# Patient Record
Sex: Female | Born: 1955 | Hispanic: No | Marital: Married | Smoking: Never smoker
Health system: Southern US, Community
[De-identification: ages and names within clinical notes are randomized; demographics above are authoritative.]

## PROBLEM LIST (undated history)

## (undated) DIAGNOSIS — C50919 Malignant neoplasm of unspecified site of unspecified female breast: Secondary | ICD-10-CM

## (undated) DIAGNOSIS — L409 Psoriasis, unspecified: Secondary | ICD-10-CM

## (undated) DIAGNOSIS — Z923 Personal history of irradiation: Secondary | ICD-10-CM

## (undated) DIAGNOSIS — F32A Depression, unspecified: Secondary | ICD-10-CM

## (undated) DIAGNOSIS — I10 Essential (primary) hypertension: Secondary | ICD-10-CM

## (undated) DIAGNOSIS — Z9221 Personal history of antineoplastic chemotherapy: Secondary | ICD-10-CM

## (undated) DIAGNOSIS — F329 Major depressive disorder, single episode, unspecified: Secondary | ICD-10-CM

## (undated) HISTORY — DX: Malignant neoplasm of unspecified site of unspecified female breast: C50.919

## (undated) HISTORY — DX: Major depressive disorder, single episode, unspecified: F32.9

## (undated) HISTORY — DX: Psoriasis, unspecified: L40.9

## (undated) HISTORY — DX: Essential (primary) hypertension: I10

## (undated) HISTORY — PX: CHOLECYSTECTOMY: SHX55

## (undated) HISTORY — DX: Depression, unspecified: F32.A

---

## 2009-08-15 DIAGNOSIS — Z923 Personal history of irradiation: Secondary | ICD-10-CM

## 2009-08-15 DIAGNOSIS — C50919 Malignant neoplasm of unspecified site of unspecified female breast: Secondary | ICD-10-CM

## 2009-08-15 DIAGNOSIS — Z9221 Personal history of antineoplastic chemotherapy: Secondary | ICD-10-CM

## 2009-08-15 HISTORY — DX: Personal history of antineoplastic chemotherapy: Z92.21

## 2009-08-15 HISTORY — DX: Personal history of irradiation: Z92.3

## 2009-08-15 HISTORY — PX: BREAST LUMPECTOMY: SHX2

## 2009-08-15 HISTORY — DX: Malignant neoplasm of unspecified site of unspecified female breast: C50.919

## 2010-06-01 ENCOUNTER — Ambulatory Visit: Payer: Self-pay | Admitting: Family Medicine

## 2010-06-03 ENCOUNTER — Ambulatory Visit: Payer: Self-pay | Admitting: Family Medicine

## 2010-06-25 ENCOUNTER — Ambulatory Visit: Payer: Self-pay | Admitting: Surgery

## 2010-07-02 ENCOUNTER — Ambulatory Visit: Payer: Self-pay | Admitting: Surgery

## 2010-07-10 ENCOUNTER — Emergency Department: Payer: Self-pay | Admitting: Unknown Physician Specialty

## 2010-07-15 LAB — PATHOLOGY REPORT

## 2010-07-22 ENCOUNTER — Ambulatory Visit: Payer: Self-pay | Admitting: Oncology

## 2010-08-05 ENCOUNTER — Ambulatory Visit: Payer: Self-pay | Admitting: Surgery

## 2010-08-15 ENCOUNTER — Ambulatory Visit: Payer: Self-pay | Admitting: Oncology

## 2010-09-15 ENCOUNTER — Ambulatory Visit: Payer: Self-pay | Admitting: Oncology

## 2010-10-14 ENCOUNTER — Ambulatory Visit: Payer: Self-pay | Admitting: Oncology

## 2010-11-14 ENCOUNTER — Ambulatory Visit: Payer: Self-pay | Admitting: Oncology

## 2010-12-14 ENCOUNTER — Ambulatory Visit: Payer: Self-pay | Admitting: Oncology

## 2011-01-14 ENCOUNTER — Ambulatory Visit: Payer: Self-pay | Admitting: Oncology

## 2011-02-13 ENCOUNTER — Ambulatory Visit: Payer: Self-pay | Admitting: Oncology

## 2011-03-16 ENCOUNTER — Ambulatory Visit: Payer: Self-pay | Admitting: Oncology

## 2011-04-16 ENCOUNTER — Ambulatory Visit: Payer: Self-pay | Admitting: Oncology

## 2011-05-16 ENCOUNTER — Ambulatory Visit: Payer: Self-pay | Admitting: Oncology

## 2011-06-16 ENCOUNTER — Ambulatory Visit: Payer: Self-pay | Admitting: Oncology

## 2011-07-16 ENCOUNTER — Ambulatory Visit: Payer: Self-pay | Admitting: Oncology

## 2011-09-05 ENCOUNTER — Ambulatory Visit: Payer: Self-pay | Admitting: Oncology

## 2011-09-16 ENCOUNTER — Ambulatory Visit: Payer: Self-pay | Admitting: Oncology

## 2011-09-29 LAB — CANCER ANTIGEN 27.29: CA 27.29: 12.4 U/mL (ref 0.0–38.6)

## 2011-10-14 ENCOUNTER — Ambulatory Visit: Payer: Self-pay | Admitting: Oncology

## 2012-01-04 ENCOUNTER — Ambulatory Visit: Payer: Self-pay | Admitting: Oncology

## 2012-01-05 LAB — CANCER ANTIGEN 27.29: CA 27.29: 11.7 U/mL (ref 0.0–38.6)

## 2012-01-14 ENCOUNTER — Ambulatory Visit: Payer: Self-pay | Admitting: Oncology

## 2012-04-11 ENCOUNTER — Ambulatory Visit: Payer: Self-pay | Admitting: Oncology

## 2012-04-13 LAB — CANCER ANTIGEN 27.29: CA 27.29: 14.5 U/mL (ref 0.0–38.6)

## 2012-04-15 ENCOUNTER — Ambulatory Visit: Payer: Self-pay | Admitting: Oncology

## 2012-06-12 ENCOUNTER — Ambulatory Visit: Payer: Self-pay | Admitting: Oncology

## 2012-07-18 ENCOUNTER — Ambulatory Visit: Payer: Self-pay | Admitting: Oncology

## 2012-08-15 ENCOUNTER — Ambulatory Visit: Payer: Self-pay | Admitting: Oncology

## 2012-09-15 ENCOUNTER — Ambulatory Visit: Payer: Self-pay | Admitting: Oncology

## 2012-10-17 ENCOUNTER — Ambulatory Visit: Payer: Self-pay | Admitting: Oncology

## 2012-11-13 ENCOUNTER — Ambulatory Visit: Payer: Self-pay | Admitting: Oncology

## 2012-12-13 ENCOUNTER — Ambulatory Visit: Payer: Self-pay | Admitting: Oncology

## 2012-12-15 ENCOUNTER — Ambulatory Visit: Payer: Self-pay

## 2013-05-10 ENCOUNTER — Ambulatory Visit: Payer: Self-pay | Admitting: Oncology

## 2013-06-14 ENCOUNTER — Ambulatory Visit: Payer: Self-pay | Admitting: Oncology

## 2013-07-24 ENCOUNTER — Ambulatory Visit: Payer: Self-pay | Admitting: Oncology

## 2013-07-25 LAB — CANCER ANTIGEN 27.29: CA 27.29: 8.1 U/mL (ref 0.0–38.6)

## 2013-08-15 ENCOUNTER — Ambulatory Visit: Payer: Self-pay | Admitting: Oncology

## 2013-09-15 ENCOUNTER — Ambulatory Visit: Payer: Self-pay | Admitting: Oncology

## 2013-11-28 ENCOUNTER — Ambulatory Visit: Payer: Self-pay | Admitting: Oncology

## 2013-12-11 ENCOUNTER — Ambulatory Visit: Payer: Self-pay | Admitting: Oncology

## 2013-12-18 ENCOUNTER — Ambulatory Visit: Payer: Self-pay | Admitting: Oncology

## 2013-12-25 ENCOUNTER — Ambulatory Visit: Payer: Self-pay | Admitting: Oncology

## 2014-01-13 ENCOUNTER — Ambulatory Visit: Payer: Self-pay | Admitting: Oncology

## 2014-03-06 DIAGNOSIS — L409 Psoriasis, unspecified: Secondary | ICD-10-CM | POA: Insufficient documentation

## 2014-03-06 DIAGNOSIS — F32A Depression, unspecified: Secondary | ICD-10-CM | POA: Insufficient documentation

## 2014-03-06 DIAGNOSIS — I10 Essential (primary) hypertension: Secondary | ICD-10-CM | POA: Insufficient documentation

## 2014-03-06 DIAGNOSIS — F329 Major depressive disorder, single episode, unspecified: Secondary | ICD-10-CM | POA: Insufficient documentation

## 2014-03-06 DIAGNOSIS — E669 Obesity, unspecified: Secondary | ICD-10-CM | POA: Insufficient documentation

## 2014-05-08 ENCOUNTER — Ambulatory Visit: Payer: Self-pay | Admitting: Oncology

## 2014-05-15 ENCOUNTER — Ambulatory Visit: Payer: Self-pay | Admitting: Oncology

## 2014-06-25 ENCOUNTER — Ambulatory Visit: Payer: Self-pay | Admitting: Oncology

## 2014-07-15 ENCOUNTER — Ambulatory Visit: Payer: Self-pay | Admitting: Oncology

## 2014-09-17 ENCOUNTER — Ambulatory Visit: Payer: Self-pay | Admitting: Oncology

## 2014-10-23 ENCOUNTER — Ambulatory Visit
Admit: 2014-10-23 | Disposition: A | Discharge status: Home / Self Care | Payer: Self-pay | Attending: Radiation Oncology | Admitting: Radiation Oncology

## 2014-11-10 ENCOUNTER — Ambulatory Visit: Payer: Self-pay | Admitting: Registered Nurse

## 2014-12-26 ENCOUNTER — Other Ambulatory Visit: Payer: Self-pay

## 2014-12-26 ENCOUNTER — Ambulatory Visit: Payer: Self-pay | Admitting: Oncology

## 2015-01-01 ENCOUNTER — Other Ambulatory Visit: Payer: Self-pay | Admitting: *Deleted

## 2015-01-01 DIAGNOSIS — C50919 Malignant neoplasm of unspecified site of unspecified female breast: Secondary | ICD-10-CM

## 2015-01-02 ENCOUNTER — Ambulatory Visit: Payer: Self-pay | Admitting: Oncology

## 2015-01-02 ENCOUNTER — Other Ambulatory Visit: Payer: Self-pay

## 2015-01-09 ENCOUNTER — Encounter: Payer: Self-pay | Admitting: Oncology

## 2015-01-09 ENCOUNTER — Inpatient Hospital Stay (HOSPITAL_BASED_OUTPATIENT_CLINIC_OR_DEPARTMENT_OTHER): Payer: BLUE CROSS/BLUE SHIELD | Admitting: Oncology

## 2015-01-09 ENCOUNTER — Inpatient Hospital Stay: Payer: BLUE CROSS/BLUE SHIELD | Attending: Oncology

## 2015-01-09 VITALS — HR 60 | Resp 20 | Wt 300.3 lb

## 2015-01-09 DIAGNOSIS — F419 Anxiety disorder, unspecified: Secondary | ICD-10-CM | POA: Diagnosis not present

## 2015-01-09 DIAGNOSIS — Z79811 Long term (current) use of aromatase inhibitors: Secondary | ICD-10-CM

## 2015-01-09 DIAGNOSIS — Z170 Estrogen receptor positive status [ER+]: Secondary | ICD-10-CM | POA: Diagnosis not present

## 2015-01-09 DIAGNOSIS — C50911 Malignant neoplasm of unspecified site of right female breast: Secondary | ICD-10-CM

## 2015-01-09 DIAGNOSIS — M818 Other osteoporosis without current pathological fracture: Secondary | ICD-10-CM | POA: Diagnosis not present

## 2015-01-09 DIAGNOSIS — I10 Essential (primary) hypertension: Secondary | ICD-10-CM

## 2015-01-09 DIAGNOSIS — C50919 Malignant neoplasm of unspecified site of unspecified female breast: Secondary | ICD-10-CM

## 2015-01-10 LAB — CANCER ANTIGEN 27.29: CA 27.29: 13.4 U/mL (ref 0.0–38.6)

## 2015-01-18 ENCOUNTER — Ambulatory Visit
Admission: EM | Admit: 2015-01-18 | Discharge: 2015-01-18 | Disposition: A | Discharge status: Home / Self Care | Payer: BLUE CROSS/BLUE SHIELD | Attending: Family Medicine | Admitting: Family Medicine

## 2015-01-18 DIAGNOSIS — N61 Inflammatory disorders of breast: Secondary | ICD-10-CM | POA: Diagnosis not present

## 2015-01-18 DIAGNOSIS — Z853 Personal history of malignant neoplasm of breast: Secondary | ICD-10-CM

## 2015-01-18 DIAGNOSIS — R21 Rash and other nonspecific skin eruption: Secondary | ICD-10-CM | POA: Diagnosis not present

## 2015-01-18 MED ORDER — DOXYCYCLINE HYCLATE 100 MG PO CAPS: 100.0000 mg | ORAL_CAPSULE | Freq: Two times a day (BID) | ORAL | Status: DC

## 2015-01-18 MED ORDER — VALACYCLOVIR HCL 1 G PO TABS
1000.0000 mg | ORAL_TABLET | Freq: Three times a day (TID) | ORAL | Status: AC
Start: 2015-01-18 — End: 2015-02-01

## 2015-01-18 NOTE — ED Notes (Signed)
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

## 2015-01-18 NOTE — ED Provider Notes (Signed)
CSN: [id number]     Arrival date & time 01/18/15  0805 History   First MD Initiated Contact with Patient 01/18/15 [phone]     Chief Complaint  Patient presents with  . Recurrent Skin Infections   she states that since having radiation for breast cancer she's had recurrent inflammation and cellulitis of the right breast. She states she will have blistering of the skin as well as redness. She just got through a course of doxycycline last week and a few days later now the eruption has started back again. She states that she's had breast biopsy to make sure that she doesn't have time for breast cancer and it was negative.  (Consider location/radiation/quality/duration/timing/severity/associated sxs/prior Treatment) Patient is a 59 y.o. female presenting with rash. The history is provided by the patient. No language interpreter was used.  Rash Location:  Torso Torso rash location:  R axilla and R chest (R breast) Quality: blistering, itchiness, redness and scaling   Severity:  Severe Onset quality:  Sudden Duration:  2 days Timing:  Constant Progression:  Worsening Chronicity:  Chronic Context comment:  She states is from radiation that she's had before Relieved by: Doxycycline usually helps it but doesn't seem to last as relief is concerned. Associated symptoms: no headaches, no shortness of breath, no tongue swelling and not vomiting     Past Medical History  Diagnosis Date  . Breast cancer   . Hypertension   . Depression   . Psoriasis    Past Surgical History  Procedure Laterality Date  . Cholecystectomy      partial  . Appendectomy     History reviewed. No pertinent family history. History  Substance Use Topics  . Smoking status: Never Smoker   . Smokeless tobacco: Never Used  . Alcohol Use: Yes     Comment: occasional   OB History    No data available     Review of Systems  Respiratory: Negative for shortness of breath.   Gastrointestinal: Negative for vomiting.  Skin:  Positive for rash.  Neurological: Negative for headaches.  All other systems reviewed and are negative.   Allergies  Tape and Cephalexin  Home Medications   Prior to Admission medications   Medication Sig Start Date End Date Taking? Authorizing Provider  Acetaminophen 500 MG coapsule Take by mouth.    Historical Provider, MD  bisoprolol-hydrochlorothiazide (ZIAC) 5-6.25 MG per tablet TAKE 1 TABLET BY MOUTH EVERY DAY 01/07/15   Historical Provider, MD  doxycycline (VIBRAMYCIN) 100 MG capsule Take 1 capsule (100 mg total) by mouth 2 (two) times daily. 01/18/15   Frederich Cha, MD  FLUoxetine (PROZAC) 10 MG capsule  12/31/14   Historical Provider, MD  letrozole [facility]) 2.5 MG tablet  12/25/14   Historical Provider, MD  ondansetron (ZOFRAN-ODT) 8 MG disintegrating tablet  11/10/14   Historical Provider, MD  valACYclovir (VALTREX) 1000 MG tablet Take 1 tablet (1,000 mg total) by mouth 3 (three) times daily. 01/18/15 02/01/15  Frederich Cha, MD   BP 134/69 mmHg  Pulse 90  Temp(Src) 98.9 F (37.2 C) (Tympanic)  Ht 5\' 6"  (1.676 m)  Wt 300 lb (136.079 kg)  BMI 48.44 kg/m2  SpO2 98% Physical Exam  Constitutional: She is oriented to person, place, and time. She appears well-developed and well-nourished. No distress.  Obese white female  HENT:  Head: Normocephalic.  Eyes: Pupils are equal, round, and reactive to light.  Neurological: She is alert and oriented to person, place, and time.  Skin: Skin  is warm. Lesion and rash noted. There is erythema.     Psychiatric: Her behavior is normal.  Vitals reviewed.   ED Course  Procedures (including critical care time) Labs Review Labs Reviewed - No data to display  Imaging Review No results found.  Patient lesion doesn't completely laboratory such as a type of inflammatory breast cancer with the blistering he could also make a case of shingles. I strongly suggest that she needs to see her oncologist if he wants to do repeat biopsies she should  submit to repeat biopsies. She also may need to see a dermatologist specializes in oncology. I was quickly rebuffed due to the cost and convenience this would have her experience. I tried to sympathize with her concerns but stress one the first thing she asked for stronger anabiotic and is difficult to give a different stronger anabiotic if you don't know which dealing with.  Since doxycycline's help before I'm going to put her back on doxycycline for 10 days since there is some blistering I will also place her on Valtrex in case there is some type of herpetic component to this. When she needs a stronger anabiotic like Zyvox for resistant staph or other infection further documentation as far as failure of current anabiotic's and culture will be needed.  She seems to be acceptable with the above plan.  MDM   1. Rash/skin eruption   2. Cellulitis of areola or nipple of female   3. Hx of breast cancer        Frederich Cha, MD 01/18/15 [phone]

## 2015-01-18 NOTE — Discharge Instructions (Signed)
Rash A rash is a change in the color or feel of your skin. There are many different types of rashes. You may have other problems along with your rash. HOME CARE  Avoid the thing that caused your rash.  Do not scratch your rash.  You may take cools baths to help stop itching.  Only take medicines as told by your doctor.  Keep all doctor visits as told. GET HELP RIGHT AWAY IF:   Your pain, puffiness (swelling), or redness gets worse.  You have a fever.  You have new or severe problems.  You have body aches, watery poop (diarrhea), or you throw up (vomit).  Your rash is not better after 3 days. MAKE SURE YOU:   Understand these instructions.  Will watch your condition.  Will get help right away if you are not doing well or get worse. Document Released: 01/18/2008 Document Revised: 10/24/2011 Document Reviewed: 05/16/2011 [facility] Patient Information 2015 [place], [place]. This information is not intended to replace advice given to you by your health care provider. Make sure you discuss any questions you have with your health care provider.  Cellulitis Cellulitis is an infection of the skin and the tissue under the skin. The infected area is usually red and tender. This happens most often in the arms and lower legs. HOME CARE   Take your antibiotic medicine as told. Finish the medicine even if you start to feel better.  Keep the infected arm or leg raised (elevated).  Put a warm cloth on the area up to 4 times per day.  Only take medicines as told by your doctor.  Keep all doctor visits as told. GET HELP IF:  You see red streaks on the skin coming from the infected area.  Your red area gets bigger or turns a dark color.  Your bone or joint under the infected area is painful after the skin heals.  Your infection comes back in the same area or different area.  You have a puffy (swollen) bump in the infected area.  You have new symptoms.  You have a fever. GET  HELP RIGHT AWAY IF:   You feel very sleepy.  You throw up (vomit) or have watery poop (diarrhea).  You feel sick and have muscle aches and pains. MAKE SURE YOU:   Understand these instructions.  Will watch your condition.  Will get help right away if you are not doing well or get worse. Document Released: 01/18/2008 Document Revised: 12/16/2013 Document Reviewed: 10/17/2011 [facility] Patient Information 2015 [place], [place]. This information is not intended to replace advice given to you by your health care provider. Make sure you discuss any questions you have with your health care provider.

## 2015-01-19 ENCOUNTER — Telehealth: Payer: Self-pay | Admitting: *Deleted

## 2015-01-19 NOTE — Telephone Encounter (Signed)
Per Dr Grayland Ormond patient to see a dermatologist, to be referred by her PMD. Patient informed of this and she said ok thank you

## 2015-01-26 ENCOUNTER — Other Ambulatory Visit: Payer: Self-pay | Admitting: *Deleted

## 2015-01-26 DIAGNOSIS — C50411 Malignant neoplasm of upper-outer quadrant of right female breast: Secondary | ICD-10-CM | POA: Insufficient documentation

## 2015-01-26 MED ORDER — LETROZOLE 2.5 MG PO TABS: 2.5000 mg | ORAL_TABLET | Freq: Every day | ORAL | Status: DC

## 2015-01-26 NOTE — Progress Notes (Signed)
[place]  [phone] [phone]  ID: Virgil Benedict OB: [birth year]  MR#: [id number]  BCW#:[id number]  Patient Care Team: Sherrin Daisy, MD as PCP - General (Family Medicine)  CHIEF COMPLAINT:  Chief Complaint  Patient presents with  . Follow-up    breast cancer    INTERVAL HISTORY: Patient returns to clinic today for routine six-month follow-up. She again has increased erythema of her right breast. Previously biopsy did not reveal any malignancy. She continues to tolerate letrozole well without significant side effects. She does not complain of hot flashes today.  She denies any myalgias or arthralgias.  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  She denies any weakness or fatigue.  She has no chest pain, shortness of breath, or cough. Patient offers no further specific complaints today.   REVIEW OF SYSTEMS:   Review of Systems  Constitutional: Negative.   Respiratory: Negative.   Gastrointestinal: Negative.   Musculoskeletal: Negative.   Skin: Positive for rash.       Erythema    As per HPI. Otherwise, a complete review of systems is negatve.  PAST MEDICAL HISTORY: Past Medical History  Diagnosis Date  . Breast cancer   . Hypertension   . Depression   . Psoriasis     PAST SURGICAL HISTORY: Past Surgical History  Procedure Laterality Date  . Cholecystectomy      partial  . Appendectomy      FAMILY HISTORY No family history on file.     ADVANCED DIRECTIVES:    HEALTH MAINTENANCE: History  Substance Use Topics  . Smoking status: Never Smoker   . Smokeless tobacco: Never Used  . Alcohol Use: Yes     Comment: occasional     Colonoscopy:  PAP:  Bone density:  Lipid panel:  Allergies  Allergen Reactions  . Tape Itching  . Cephalexin Rash    Current Outpatient Prescriptions  Medication Sig Dispense Refill  . Acetaminophen 500 MG coapsule Take by mouth.    . bisoprolol-hydrochlorothiazide (ZIAC)  5-6.25 MG per tablet TAKE 1 TABLET BY MOUTH EVERY DAY    . FLUoxetine (PROZAC) 10 MG capsule     . ondansetron (ZOFRAN-ODT) 8 MG disintegrating tablet   0  . doxycycline (VIBRAMYCIN) 100 MG capsule Take 1 capsule (100 mg total) by mouth 2 (two) times daily. 20 capsule 0  . letrozole (FEMARA) 2.5 MG tablet Take 1 tablet (2.5 mg total) by mouth daily. 90 tablet 4  . valACYclovir (VALTREX) 1000 MG tablet Take 1 tablet (1,000 mg total) by mouth 3 (three) times daily. 21 tablet 0   No current facility-administered medications for this visit.    OBJECTIVE: Filed Vitals:   01/09/15 1150  Pulse: 60  Resp: 20     There is no height on file to calculate BMI.    ECOG FS:0 - Asymptomatic  General: Well-developed, well-nourished, no acute distress. Eyes: anicteric sclera. Breasts: Bilateral breast and axilla without lumps or masses. Lungs: Clear to auscultation bilaterally. Heart: Regular rate and rhythm. No rubs, murmurs, or gallops. Abdomen: Soft, nontender, nondistended. No organomegaly noted, normoactive bowel sounds. Musculoskeletal: No edema, cyanosis, or clubbing. Neuro: Alert, answering all questions appropriately. Cranial nerves grossly intact. Skin: Active psoriasis on much of her torso, increased erythema of right breast. Psych: Normal affect.   LAB RESULTS:  No results found for: NA, K, CL, CO2, GLUCOSE, BUN, CREATININE, CALCIUM, PROT, ALBUMIN, AST, ALT, ALKPHOS, BILITOT, GFRNONAA, GFRAA  No results found for: WBC, NEUTROABS, HGB, HCT,  MCV, PLT   STUDIES: No results found.  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  PLAN:    1.  Breast cancer: Continue letrozole daily, completing in August of 2017.  Patient's most recent mammogram and ultrasound on September 17, 2014 was reported as BI-RADS 2, repeat in one year.  Bone mineral density on November 28, 2013 was reported as T score of -2.6, continue Fosamax as prescribed. Patient has  been instructed to continue calcium and vitamin D.  Return to clinic in 6 months for routine evaluation. Patient understands she can return to clinic at any time if she has any questions, concerns, or complaints. 2.  Anxiety: Continue Prozac 20 mg as well as Xanax 0.25 mg as needed. 3.  Erythema: Resolved.  Biopsy was negative for malignancy, return to dermatology as scheduled. 4. Osteoporosis: Bone marrow density as above. Continue Fosamax, calcium, and vitamin D.  Patient expressed understanding and was in agreement with this plan. She also understands that She can call clinic at any time with any questions, concerns, or complaints.   Breast cancer   Staging form: Breast, AJCC 7th Edition     Clinical stage from 01/26/2015: Stage IIA (T1c, N1, M0) - Signed by Lloyd Huger, MD on 01/26/2015   Lloyd Huger, MD   01/26/2015 4:34 PM

## 2015-02-23 ENCOUNTER — Telehealth: Payer: Self-pay | Admitting: *Deleted

## 2015-02-23 NOTE — Telephone Encounter (Signed)
Pt walked into [facility] in [place] this afternoon holding a pathology report. She states the dermatologist sent her pathology slides out from last Oct for a second opinion. She has that report. Patient is concerned  And wondering what she should do about her breast now.1- should she have another biopsy? 2- does she need a PET/MRI or somekind of scan done.3- does she need a mastectomy? Patient requested I send a report to her surgeon, Dr Tamala Julian. I am faxing the path report to him today. I have a copy for Dr Grayland Ormond to evaluate here as well.

## 2015-03-20 ENCOUNTER — Other Ambulatory Visit: Payer: Self-pay | Admitting: *Deleted

## 2015-03-20 ENCOUNTER — Inpatient Hospital Stay: Payer: BLUE CROSS/BLUE SHIELD | Attending: Oncology | Admitting: Oncology

## 2015-03-20 ENCOUNTER — Inpatient Hospital Stay: Payer: BLUE CROSS/BLUE SHIELD

## 2015-03-20 VITALS — HR 63 | Temp 98.0°F | Resp 16 | Wt 300.5 lb

## 2015-03-20 DIAGNOSIS — I10 Essential (primary) hypertension: Secondary | ICD-10-CM | POA: Diagnosis not present

## 2015-03-20 DIAGNOSIS — L539 Erythematous condition, unspecified: Secondary | ICD-10-CM | POA: Diagnosis not present

## 2015-03-20 DIAGNOSIS — M810 Age-related osteoporosis without current pathological fracture: Secondary | ICD-10-CM | POA: Diagnosis not present

## 2015-03-20 DIAGNOSIS — Z79899 Other long term (current) drug therapy: Secondary | ICD-10-CM | POA: Diagnosis not present

## 2015-03-20 DIAGNOSIS — C50911 Malignant neoplasm of unspecified site of right female breast: Secondary | ICD-10-CM | POA: Diagnosis present

## 2015-03-20 DIAGNOSIS — Z79811 Long term (current) use of aromatase inhibitors: Secondary | ICD-10-CM | POA: Diagnosis not present

## 2015-03-20 DIAGNOSIS — Z170 Estrogen receptor positive status [ER+]: Secondary | ICD-10-CM | POA: Insufficient documentation

## 2015-03-20 DIAGNOSIS — F419 Anxiety disorder, unspecified: Secondary | ICD-10-CM | POA: Diagnosis not present

## 2015-03-20 DIAGNOSIS — F329 Major depressive disorder, single episode, unspecified: Secondary | ICD-10-CM | POA: Insufficient documentation

## 2015-03-20 DIAGNOSIS — I890 Lymphedema, not elsewhere classified: Secondary | ICD-10-CM | POA: Diagnosis not present

## 2015-03-20 NOTE — Progress Notes (Signed)
[place]  [phone] [phone]  ID: Tiffany Wilson OB: October 19, 1955  MR#: [id number]  XQJ#:[id number]  Patient Care Team: Sherrin Daisy, MD as PCP - General (Family Medicine)  CHIEF COMPLAINT:  Chief Complaint  Patient presents with  . Follow-up    Pt is here today to discuss lymphedema of her right arm and further testing...    INTERVAL HISTORY: Patient returns to clinic today as an add-on for further evaluation of her persistently erythematous right breast. Previously biopsy did not reveal any malignancy. Multiple courses of anti-biotics have not seen to help.  She continues to tolerate letrozole well without significant side effects. She does not complain of hot flashes today.  She denies any myalgias or arthralgias.  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  She denies any weakness or fatigue.  She has no chest pain, shortness of breath, or cough. Patient offers no further specific complaints today.   REVIEW OF SYSTEMS:   Review of Systems  Constitutional: Negative.   Respiratory: Negative.   Gastrointestinal: Negative.   Musculoskeletal: Negative.   Skin: Positive for rash.       Erythema    As per HPI. Otherwise, a complete review of systems is negatve.  PAST MEDICAL HISTORY: Past Medical History  Diagnosis Date  . Breast cancer   . Hypertension   . Depression   . Psoriasis     PAST SURGICAL HISTORY: Past Surgical History  Procedure Laterality Date  . Cholecystectomy      partial  . Appendectomy      FAMILY HISTORY No family history on file.     ADVANCED DIRECTIVES:    HEALTH MAINTENANCE: History  Substance Use Topics  . Smoking status: Never Smoker   . Smokeless tobacco: Never Used  . Alcohol Use: Yes     Comment: occasional     Colonoscopy:  PAP:  Bone density:  Lipid panel:  Allergies  Allergen Reactions  . Tape Itching  . Cephalexin Rash    Current Outpatient Prescriptions  Medication  Sig Dispense Refill  . Acetaminophen 500 MG coapsule Take 2 capsules by mouth every 6 (six) hours as needed.     . bisoprolol-hydrochlorothiazide (ZIAC) 5-6.25 MG per tablet TAKE 1 TABLET BY MOUTH EVERY DAY    . doxycycline (VIBRAMYCIN) 100 MG capsule Take 1 capsule (100 mg total) by mouth 2 (two) times daily. 20 capsule 0  . fluconazole (DIFLUCAN) 200 MG tablet TK 1 T PO WEEKLY  3  . FLUoxetine (PROZAC) 10 MG capsule     . ibuprofen (ADVIL,MOTRIN) 200 MG tablet Take 800 mg by mouth every 8 (eight) hours as needed for moderate pain.    Marland Kitchen letrozole (FEMARA) 2.5 MG tablet Take 1 tablet (2.5 mg total) by mouth daily. 90 tablet 4  . ondansetron (ZOFRAN-ODT) 8 MG disintegrating tablet   0  . triamcinolone ointment (KENALOG) 0.1 % APP AA BID  0   No current facility-administered medications for this visit.    OBJECTIVE: Filed Vitals:   03/20/15 1355  Pulse: 63  Temp: 98 F (36.7 C)  Resp: 16     Body mass index is 48.52 kg/(m^2).    ECOG FS:0 - Asymptomatic  General: Well-developed, well-nourished, no acute distress. Eyes: anicteric sclera. Breasts: Right breast with significant erythema. Lungs: Clear to auscultation bilaterally. Heart: Regular rate and rhythm. No rubs, murmurs, or gallops. Abdomen: Soft, nontender, nondistended. No organomegaly noted, normoactive bowel sounds. Musculoskeletal: No edema, cyanosis, or clubbing. Neuro: Alert, answering  all questions appropriately. Cranial nerves grossly intact. Skin: Active psoriasis on much of her torso, increased erythema of right breast. Psych: Normal affect.   LAB RESULTS:  No results found for: NA, K, CL, CO2, GLUCOSE, BUN, CREATININE, CALCIUM, PROT, ALBUMIN, AST, ALT, ALKPHOS, BILITOT, GFRNONAA, GFRAA  No results found for: WBC, NEUTROABS, HGB, HCT, MCV, PLT   STUDIES: No results found.  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  PLAN:    1.  Breast cancer: Continue  letrozole daily, completing in August of 2017.  Patient's most recent mammogram and ultrasound on September 17, 2014 was reported as BI-RADS 2, repeat in one year.  Bone mineral density on November 28, 2013 was reported as T score of -2.6, continue Fosamax as prescribed. Patient has been instructed to continue calcium and vitamin D.   2.  Anxiety: Continue Prozac 20 mg as well as Xanax 0.25 mg as needed. 3.  Erythema: Recurrent. Unclear etiology. 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 4.  Osteoporosis: Bone marrow density as above. Continue Fosamax, calcium, and vitamin D. Next line 5. Disposition: Return to clinic in 1-2 weeks after her MRI to discuss the results and further diagnostic planning.  Approximately 30 minutes was spent in discussion and consultation.  Patient expressed understanding and was in agreement with this plan. She also understands that She can call clinic at any time with any questions, concerns, or complaints.   Breast cancer   Staging form: Breast, AJCC 7th Edition     Clinical stage from 01/26/2015: Stage IIA (T1c, N1, M0) - Signed by Lloyd Huger, MD on 01/26/2015   Lloyd Huger, MD   03/20/2015 4:07 PM

## 2015-03-21 LAB — CA 27.29 (SERIAL MONITOR): CA 27.29: 9.3 U/mL (ref 0.0–38.6)

## 2015-04-01 ENCOUNTER — Telehealth: Payer: Self-pay | Admitting: Oncology

## 2015-04-01 NOTE — Telephone Encounter (Signed)
She still hasn't heard about MRI. RNs are supposed to be setting up breast MRI in [place]. She will be going out of town in two weeks from 26th until end of month and then on Sept. 12th out of town again. She needs a morning appt. Please advise. [phone].

## 2015-04-01 NOTE — Telephone Encounter (Signed)
The information has been faxed to [facility], where the breast MRIs are performed, we are waiting to hear from them.

## 2015-04-06 ENCOUNTER — Inpatient Hospital Stay: Payer: BLUE CROSS/BLUE SHIELD | Admitting: Oncology

## 2015-04-10 NOTE — Telephone Encounter (Signed)
The breast MRI is scheduled for 04/22/15 per EPIC schedule.

## 2015-04-22 ENCOUNTER — Ambulatory Visit
Admission: RE | Admit: 2015-04-22 | Discharge: 2015-04-22 | Disposition: A | Discharge status: Home / Self Care | Payer: BLUE CROSS/BLUE SHIELD | Source: Ambulatory Visit | Attending: Oncology | Admitting: Oncology

## 2015-04-22 DIAGNOSIS — C50911 Malignant neoplasm of unspecified site of right female breast: Secondary | ICD-10-CM

## 2015-04-22 MED ORDER — GADOBENATE DIMEGLUMINE 529 MG/ML IV SOLN
20.0000 mL | Freq: Once | INTRAVENOUS | Status: AC | PRN
  Administered 2015-04-22: 20 mL via INTRAVENOUS

## 2015-05-01 ENCOUNTER — Inpatient Hospital Stay: Payer: BLUE CROSS/BLUE SHIELD | Attending: Oncology | Admitting: Oncology

## 2015-05-01 DIAGNOSIS — I10 Essential (primary) hypertension: Secondary | ICD-10-CM | POA: Insufficient documentation

## 2015-05-01 DIAGNOSIS — Z79899 Other long term (current) drug therapy: Secondary | ICD-10-CM | POA: Insufficient documentation

## 2015-05-01 DIAGNOSIS — M818 Other osteoporosis without current pathological fracture: Secondary | ICD-10-CM | POA: Insufficient documentation

## 2015-05-01 DIAGNOSIS — F329 Major depressive disorder, single episode, unspecified: Secondary | ICD-10-CM | POA: Insufficient documentation

## 2015-05-01 DIAGNOSIS — L539 Erythematous condition, unspecified: Secondary | ICD-10-CM | POA: Insufficient documentation

## 2015-05-01 DIAGNOSIS — C50911 Malignant neoplasm of unspecified site of right female breast: Secondary | ICD-10-CM | POA: Insufficient documentation

## 2015-05-01 DIAGNOSIS — Z170 Estrogen receptor positive status [ER+]: Secondary | ICD-10-CM | POA: Insufficient documentation

## 2015-05-01 DIAGNOSIS — F419 Anxiety disorder, unspecified: Secondary | ICD-10-CM | POA: Insufficient documentation

## 2015-05-08 ENCOUNTER — Ambulatory Visit: Payer: BLUE CROSS/BLUE SHIELD | Admitting: Oncology

## 2015-05-08 ENCOUNTER — Inpatient Hospital Stay (HOSPITAL_BASED_OUTPATIENT_CLINIC_OR_DEPARTMENT_OTHER): Payer: BLUE CROSS/BLUE SHIELD | Admitting: Oncology

## 2015-05-08 VITALS — BP 177/79 | HR 56 | Temp 96.0°F | Resp 20 | Wt 300.5 lb

## 2015-05-08 DIAGNOSIS — M818 Other osteoporosis without current pathological fracture: Secondary | ICD-10-CM

## 2015-05-08 DIAGNOSIS — L539 Erythematous condition, unspecified: Secondary | ICD-10-CM

## 2015-05-08 DIAGNOSIS — C50911 Malignant neoplasm of unspecified site of right female breast: Secondary | ICD-10-CM

## 2015-05-08 DIAGNOSIS — Z170 Estrogen receptor positive status [ER+]: Secondary | ICD-10-CM

## 2015-05-08 DIAGNOSIS — F419 Anxiety disorder, unspecified: Secondary | ICD-10-CM

## 2015-05-08 DIAGNOSIS — I10 Essential (primary) hypertension: Secondary | ICD-10-CM | POA: Diagnosis not present

## 2015-05-08 DIAGNOSIS — Z79899 Other long term (current) drug therapy: Secondary | ICD-10-CM | POA: Diagnosis not present

## 2015-05-08 DIAGNOSIS — F329 Major depressive disorder, single episode, unspecified: Secondary | ICD-10-CM | POA: Diagnosis not present

## 2015-05-08 NOTE — Progress Notes (Signed)
Patient here today for results of breast MRI.

## 2015-05-18 ENCOUNTER — Telehealth: Payer: Self-pay | Admitting: *Deleted

## 2015-05-18 NOTE — Telephone Encounter (Signed)
States that when she saw Dr Grayland Ormond, he said Dr Baruch Gouty would call her to see her, but she states Dr Baruch Gouty does not want to see her and wants her to see her surgeon, but she saw her surgeon first who referred her to  Dr Grayland Ormond. Concerned that the infection is coming back and wants something done

## 2015-05-19 ENCOUNTER — Telehealth: Payer: Self-pay | Admitting: Oncology

## 2015-05-19 NOTE — Telephone Encounter (Signed)
Forwarded to MD.

## 2015-05-19 NOTE — Telephone Encounter (Signed)
Running out of antibiotic, sulfameth -TMP 800/160 mg twice a day. [name] told Tiffany Wilson he was going to get together with another radiation oncologist and contact her and she is pretty upset that she hasn't heard anything yet. [phone]. Please call asap.

## 2015-05-20 ENCOUNTER — Other Ambulatory Visit: Payer: Self-pay | Admitting: *Deleted

## 2015-05-20 ENCOUNTER — Other Ambulatory Visit: Payer: Self-pay | Admitting: Oncology

## 2015-05-20 MED ORDER — PREDNISONE 20 MG PO TABS: 20.0000 mg | ORAL_TABLET | Freq: Every day | ORAL | Status: DC

## 2015-05-20 NOTE — Telephone Encounter (Signed)
Called pt adn instructed that abx not being order, steroids are being ordered after Dr Grayland Ormond spoke with Dr Karna Christmas Onc in [place]. She was fine with this plan and said she will wait ot start it tomorrow morning so it does not affect her sleep as much. She will call us back if this does not improve

## 2015-05-20 NOTE — Telephone Encounter (Signed)
states she needs her Sulfa refilled, she only has 2 more days of med and she states the rad onc appt has nothing to do with the abx being refilled

## 2015-05-20 NOTE — Telephone Encounter (Signed)
Playing phone tag with rad onc.  As soon as we talk, I will let her know the plan.

## 2015-05-22 NOTE — Progress Notes (Signed)
[place]  Telephone:(336) [phone] [phone]) [phone]  ID: Tiffany Wilson OB: [birth year]  MR#: [id number]  KAJ#:[id number]  Patient Care Team: Sherrin Daisy, MD as PCP - General (Family Medicine)  CHIEF COMPLAINT:  Chief Complaint  Patient presents with  . Breast Cancer    follow up    INTERVAL HISTORY: Patient returns to clinic today for further evaluation of her persistently erythematous right breast and discussion of her MRI results. Previously biopsy did not reveal any malignancy. Multiple courses of anti-biotics have not seen to help.  She continues to tolerate letrozole well without significant side effects. She does not complain of hot flashes today.  She denies any myalgias or arthralgias.  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  She denies any weakness or fatigue.  She has no chest pain, shortness of breath, or cough. Patient offers no further specific complaints today.   REVIEW OF SYSTEMS:   Review of Systems  Constitutional: Negative.   Respiratory: Negative.   Gastrointestinal: Negative.   Musculoskeletal: Negative.   Skin: Positive for rash.       Erythema    As per HPI. Otherwise, a complete review of systems is negatve.  PAST MEDICAL HISTORY: Past Medical History  Diagnosis Date  . Breast cancer   . Hypertension   . Depression   . Psoriasis     PAST SURGICAL HISTORY: Past Surgical History  Procedure Laterality Date  . Cholecystectomy      partial  . Appendectomy      FAMILY HISTORY No family history on file.     ADVANCED DIRECTIVES:    HEALTH MAINTENANCE: Social History  Substance Use Topics  . Smoking status: Never Smoker   . Smokeless tobacco: Never Used  . Alcohol Use: Yes     Comment: occasional     Colonoscopy:  PAP:  Bone density:  Lipid panel:  Allergies  Allergen Reactions  . Cephalexin Rash  . Tape Itching    Current Outpatient Prescriptions  Medication Sig Dispense Refill  .  Acetaminophen 500 MG coapsule Take 2 capsules by mouth every 6 (six) hours as needed.     . bisoprolol-hydrochlorothiazide (ZIAC) 5-6.25 MG per tablet TAKE 1 TABLET BY MOUTH EVERY DAY    . fluconazole (DIFLUCAN) 200 MG tablet TK 1 T PO WEEKLY  3  . FLUoxetine (PROZAC) 10 MG capsule     . ibuprofen (ADVIL,MOTRIN) 200 MG tablet Take 800 mg by mouth every 8 (eight) hours as needed for moderate pain.    Marland Kitchen letrozole (FEMARA) 2.5 MG tablet Take 1 tablet (2.5 mg total) by mouth daily. 90 tablet 4  . ondansetron (ZOFRAN-ODT) 8 MG disintegrating tablet   0  . sulfamethoxazole-trimethoprim (BACTRIM DS,SEPTRA DS) 800-160 MG per tablet Take 1 tablet by mouth 2 (two) times daily.    Marland Kitchen triamcinolone ointment (KENALOG) 0.1 % APP AA BID  0  . doxycycline (VIBRAMYCIN) 100 MG capsule Take 1 capsule (100 mg total) by mouth 2 (two) times daily. (Patient not taking: Reported on 05/08/2015) 20 capsule 0  . predniSONE (DELTASONE) 20 MG tablet Take 1 tablet (20 mg total) by mouth daily with breakfast. Take 72m (3 tabs) for 7 days.  Then follow taper:  43m(2 tabs) for 2 days, 2020m1 tab) for 2 days, 10 mg (1/2 tab) for 2 days 5mg4m/4 tab) for 2 days then stop. 30 tablet 2   No current facility-administered medications for this visit.    OBJECTIVE: Filed Vitals:   05/08/15  1159  BP: 177/79  Pulse: 56  Temp: 96 F (35.6 C)  Resp: 20     Body mass index is 48.52 kg/(m^2).    ECOG FS:0 - Asymptomatic  General: Well-developed, well-nourished, no acute distress. Eyes: anicteric sclera. Breasts: Right breast with significant erythema. Lungs: Clear to auscultation bilaterally. Heart: Regular rate and rhythm. No rubs, murmurs, or gallops. Abdomen: Soft, nontender, nondistended. No organomegaly noted, normoactive bowel sounds. Musculoskeletal: No edema, cyanosis, or clubbing. Neuro: Alert, answering all questions appropriately. Cranial nerves grossly intact. Skin: Active psoriasis on much of her torso, increased  erythema of right breast. Psych: Normal affect.   LAB RESULTS:  No results found for: NA, K, CL, CO2, GLUCOSE, BUN, CREATININE, CALCIUM, PROT, ALBUMIN, AST, ALT, ALKPHOS, BILITOT, GFRNONAA, GFRAA  No results found for: WBC, NEUTROABS, HGB, HCT, MCV, PLT   STUDIES: No results found.  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  PLAN:    1.  Breast cancer: Continue letrozole daily, completing in August of 2017.  Patient's most recent mammogram and ultrasound on September 17, 2014 was reported as BI-RADS 2, repeat in one year.  Patient had a breast MRI in the last week that she revealed skin thickening, but no obvious evidence of malignancy. Return to clinic as previously scheduled on July 31, 2015 for further evaluation.  2. Osteoporosis: Bone mineral density on November 28, 2013 was reported as T score of -2.6, continue Fosamax as prescribed. Patient has been instructed to continue calcium and vitamin D.   3.  Anxiety: Continue Prozac 20 mg as well as Xanax 0.25 mg as needed. 4.  Erythema: Recurrent. Unclear etiology. 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  After discussion with radiation oncology, the thought that this could possibly be a late incidence of radiation recall with an unknown contributing factor.  Will place patient on a two-week prednisone taper to assess for improvement. Patient has been instructed to call clinic at the end of the 2 weeks to report whether her symptoms have resolved   Approximately 30 minutes was spent in discussion and consultation.  Patient expressed understanding and was in agreement with this plan. She also understands that She can call clinic at any time with any questions, concerns, or complaints.   Breast cancer   Staging form: Breast, AJCC 7th  Edition     Clinical stage from 01/26/2015: Stage IIA (T1c, N1, M0) - Signed by Lloyd Huger, MD on 01/26/2015   Lloyd Huger, MD   05/22/2015 1:33 PM

## 2015-06-26 ENCOUNTER — Telehealth: Payer: Self-pay | Admitting: *Deleted

## 2015-06-26 NOTE — Telephone Encounter (Signed)
FYI...  Patient has appointment to see Dr. Grayland Ormond in [place] on 11/18.

## 2015-06-26 NOTE — Telephone Encounter (Signed)
Ok, let me see her in [place] next week so she doesn't have to wait until Friday.

## 2015-06-26 NOTE — Telephone Encounter (Signed)
Spoke with Mickel Baas to get appointment moved to [facility] next week.

## 2015-06-26 NOTE — Telephone Encounter (Signed)
Called to state that she is just finishing up her prednisone and the redness is already coming back. It is not as bad as it was and she is not fevered. States she thinks doctor may want to reorder prednisone stronger than the last dose was

## 2015-06-29 ENCOUNTER — Inpatient Hospital Stay: Payer: BLUE CROSS/BLUE SHIELD | Attending: Oncology | Admitting: Oncology

## 2015-06-29 VITALS — BP 161/80 | HR 57 | Temp 96.4°F | Resp 20 | Wt 305.6 lb

## 2015-06-29 DIAGNOSIS — F419 Anxiety disorder, unspecified: Secondary | ICD-10-CM | POA: Diagnosis not present

## 2015-06-29 DIAGNOSIS — I10 Essential (primary) hypertension: Secondary | ICD-10-CM | POA: Insufficient documentation

## 2015-06-29 DIAGNOSIS — C50911 Malignant neoplasm of unspecified site of right female breast: Secondary | ICD-10-CM

## 2015-06-29 DIAGNOSIS — M810 Age-related osteoporosis without current pathological fracture: Secondary | ICD-10-CM

## 2015-06-29 DIAGNOSIS — Z79899 Other long term (current) drug therapy: Secondary | ICD-10-CM | POA: Diagnosis not present

## 2015-06-29 DIAGNOSIS — Z79811 Long term (current) use of aromatase inhibitors: Secondary | ICD-10-CM | POA: Diagnosis not present

## 2015-06-29 DIAGNOSIS — Z170 Estrogen receptor positive status [ER+]: Secondary | ICD-10-CM | POA: Diagnosis not present

## 2015-06-29 NOTE — Progress Notes (Signed)
Patient here today for follow up regarding breast cancer. Patient reports redness, irritation and blisters to breast, this has been an ongoing issue for patient. She currently reports that she has restarted prednisone and doxycycline.

## 2015-07-01 ENCOUNTER — Other Ambulatory Visit: Payer: Self-pay | Admitting: *Deleted

## 2015-07-01 DIAGNOSIS — L589 Radiodermatitis, unspecified: Secondary | ICD-10-CM

## 2015-07-02 MED ORDER — PREDNISONE 20 MG PO TABS: 20.0000 mg | ORAL_TABLET | Freq: Every day | ORAL | Status: DC

## 2015-07-03 ENCOUNTER — Other Ambulatory Visit: Payer: BLUE CROSS/BLUE SHIELD

## 2015-07-03 ENCOUNTER — Ambulatory Visit: Payer: BLUE CROSS/BLUE SHIELD | Admitting: Oncology

## 2015-07-14 NOTE — Progress Notes (Signed)
[place]  Telephone:(336) [phone] Fax:(336) [phone]  ID: Virgil Benedict OB: 1955/12/18  MR#: [id number]  OZD#:[id number]  Patient Care Team: Sherrin Daisy, MD as PCP - General (Family Medicine)  CHIEF COMPLAINT:  Chief Complaint  Patient presents with  . Breast Cancer    follow up    INTERVAL HISTORY: Patient returns to clinic today for further evaluation of her persistently erythematous right breast. Patient states her rest improve with her steroid taper, but when she decreases her dose of less than 20 mg she notices it flaring back up again. Previously biopsy did not reveal any malignancy. Multiple courses of antibiotics have not been helpful either.  She continues to tolerate letrozole well without significant side effects. She does not complain of hot flashes today.  She denies any myalgias or arthralgias.  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  She denies any weakness or fatigue.  She has no chest pain, shortness of breath, or cough. Patient offers no further specific complaints today.   REVIEW OF SYSTEMS:   Review of Systems  Constitutional: Negative.   Respiratory: Negative.   Gastrointestinal: Negative.   Musculoskeletal: Negative.   Skin: Positive for rash.       Erythema    As per HPI. Otherwise, a complete review of systems is negatve.  PAST MEDICAL HISTORY: Past Medical History  Diagnosis Date  . Breast cancer   . Hypertension   . Depression   . Psoriasis     PAST SURGICAL HISTORY: Past Surgical History  Procedure Laterality Date  . Cholecystectomy      partial  . Appendectomy      FAMILY HISTORY: Reviewed and unchanged. No reported history of malignancy or chronic disease.     ADVANCED DIRECTIVES:    HEALTH MAINTENANCE: Social History  Substance Use Topics  . Smoking status: Never Smoker   . Smokeless tobacco: Never Used  . Alcohol Use: Yes     Comment: occasional     Colonoscopy:  PAP:  Bone  density:  Lipid panel:  Allergies  Allergen Reactions  . Cephalexin Rash  . Tape Itching    Current Outpatient Prescriptions  Medication Sig Dispense Refill  . Acetaminophen 500 MG coapsule Take 2 capsules by mouth every 6 (six) hours as needed.     . bisoprolol-hydrochlorothiazide (ZIAC) 5-6.25 MG per tablet TAKE 1 TABLET BY MOUTH EVERY DAY    . doxycycline (VIBRAMYCIN) 100 MG capsule Take 1 capsule (100 mg total) by mouth 2 (two) times daily. 20 capsule 0  . fluconazole (DIFLUCAN) 200 MG tablet TK 1 T PO WEEKLY  3  . FLUoxetine (PROZAC) 10 MG capsule     . ibuprofen (ADVIL,MOTRIN) 200 MG tablet Take 800 mg by mouth every 8 (eight) hours as needed for moderate pain.    Marland Kitchen letrozole (FEMARA) 2.5 MG tablet Take 1 tablet (2.5 mg total) by mouth daily. 90 tablet 4  . ondansetron (ZOFRAN-ODT) 8 MG disintegrating tablet   0  . predniSONE (DELTASONE) 20 MG tablet Take 1 tablet (20 mg total) by mouth daily with breakfast. Take 54m (3 tabs) for 7 days.  Then follow taper:  430m(2 tabs) for 2 days, 2062m1 tab) for 2 days, 10 mg (1/2 tab) for 2 days 5mg10m/4 tab) for 2 days then stop. 30 tablet 2  . sulfamethoxazole-trimethoprim (BACTRIM DS,SEPTRA DS) 800-160 MG per tablet Take 1 tablet by mouth 2 (two) times daily.    . trMarland Kitchenamcinolone ointment (KENALOG) 0.1 % APP AA  BID  0  . predniSONE (DELTASONE) 20 MG tablet Take 1 tablet (20 mg total) by mouth daily with breakfast. 30 tablet 2   No current facility-administered medications for this visit.    OBJECTIVE: Filed Vitals:   06/29/15 0916  BP: 161/80  Pulse: 57  Temp: 96.4 F (35.8 C)  Resp: 20     Body mass index is 49.34 kg/(m^2).    ECOG FS:0 - Asymptomatic  General: Well-developed, well-nourished, no acute distress. Eyes: anicteric sclera. Breasts: Right breast with significant erythema, mildly improved since last exam. Lungs: Clear to auscultation bilaterally. Heart: Regular rate and rhythm. No rubs, murmurs, or gallops. Abdomen:  Soft, nontender, nondistended. No organomegaly noted, normoactive bowel sounds. Musculoskeletal: No edema, cyanosis, or clubbing. Neuro: Alert, answering all questions appropriately. Cranial nerves grossly intact. Skin: Active psoriasis on much of her torso, increased erythema of right breast. Psych: Normal affect.   LAB RESULTS:  No results found for: NA, K, CL, CO2, GLUCOSE, BUN, CREATININE, CALCIUM, PROT, ALBUMIN, AST, ALT, ALKPHOS, BILITOT, GFRNONAA, GFRAA  No results found for: WBC, NEUTROABS, HGB, HCT, MCV, PLT   STUDIES: No results found.  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  PLAN:    1.  Breast cancer: Continue letrozole daily, completing in August of 2017.  Patient's most recent mammogram and ultrasound on September 17, 2014 was reported as BI-RADS 2, repeat in one year.  Patient had a breast MRI in the last week that she revealed skin thickening, but no obvious evidence of malignancy. Return to clinic as previously scheduled on July 31, 2015 for further evaluation.  2. Osteoporosis: Bone mineral density on November 28, 2013 was reported as T score of -2.6, continue Fosamax as prescribed. Patient has been instructed to continue calcium and vitamin D.   3.  Anxiety: Continue Prozac 20 mg as well as Xanax 0.25 mg as needed. 4.  Breast erythema: Recurrent. Unclear etiology. 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  After discussion with radiation oncology, the thought that this could possibly be a late incidence of radiation recall with an unknown contributing factor, possibly letrozole?  Patient states prednisone taper improved her erythema, but once her dose was below 20 mg her symptoms became worse again. She has been instructed to continue 20 mg daily until she  is evaluated by radiation oncology in [place] on July 22, 2015. She has been instructed to keep her follow-up appointment as previously scheduled on December 16.    Approximately 30 minutes was spent in discussion of which greater than 50% was consultation.  Patient expressed understanding and was in agreement with this plan. She also understands that She can call clinic at any time with any questions, concerns, or complaints.   Breast cancer   Staging form: Breast, AJCC 7th Edition     Clinical stage from 01/26/2015: Stage IIA (T1c, N1, M0) - Signed by Lloyd Huger, MD on 01/26/2015   Lloyd Huger, MD   07/14/2015 1:04 PM

## 2015-07-16 ENCOUNTER — Ambulatory Visit: Payer: BLUE CROSS/BLUE SHIELD | Admitting: Radiation Oncology

## 2015-07-16 ENCOUNTER — Ambulatory Visit: Payer: BLUE CROSS/BLUE SHIELD

## 2015-07-22 ENCOUNTER — Ambulatory Visit
Admission: RE | Admit: 2015-07-22 | Discharge: 2015-07-22 | Disposition: A | Discharge status: Home / Self Care | Payer: BLUE CROSS/BLUE SHIELD | Source: Ambulatory Visit | Attending: Radiation Oncology | Admitting: Radiation Oncology

## 2015-07-22 ENCOUNTER — Encounter: Payer: Self-pay | Admitting: Radiation Oncology

## 2015-07-22 VITALS — BP 137/56 | HR 50 | Temp 97.7°F | Ht 66.0 in | Wt 310.3 lb

## 2015-07-22 DIAGNOSIS — Z08 Encounter for follow-up examination after completed treatment for malignant neoplasm: Secondary | ICD-10-CM | POA: Diagnosis present

## 2015-07-22 DIAGNOSIS — Z853 Personal history of malignant neoplasm of breast: Secondary | ICD-10-CM | POA: Insufficient documentation

## 2015-07-22 DIAGNOSIS — T66XXXA Radiation sickness, unspecified, initial encounter: Secondary | ICD-10-CM

## 2015-07-22 DIAGNOSIS — Z9889 Other specified postprocedural states: Secondary | ICD-10-CM | POA: Insufficient documentation

## 2015-07-22 DIAGNOSIS — Z923 Personal history of irradiation: Secondary | ICD-10-CM | POA: Diagnosis not present

## 2015-07-22 NOTE — Progress Notes (Addendum)
Radiation Oncology         [phone]) [phone] ________________________________  Initial outpatient Consultation - Date: 07/22/2015   Name: Tiffany Wilson MRN: [id number]   DOB: 03/05/1956  REFERRING PHYSICIAN: Lloyd Huger, MD  DIAGNOSIS AND STAGE: Radiation Recall of the Right Breast  HISTORY OF PRESENT ILLNESS::Gracelynn B Caniglia is a 59 y.o. female with a history of right breast cancer treated with lumpectomy and radiation. Per outside records, she had Stage II, ER/PR positive, HER2 negative adenocarcinoma of the right breast. Her initial diagnosis of cancer was found through self palpation. She saw her PCP who subsequently referred her to Dr. Rochel Brome who did her needle biopsy. She had surgery first and then chemotherapy with radiation afterwards. Lumpectomy of the right breast on 07/15/10 showed a 1.5 cm invasive ductal carcinoma. 1 sentinel lymph node had a 2 mm metastasis. Her margins were negative. Grade 3 tumor was ER positive at 90%, PR positive at 80%, and HER2 negative.She reports she broke out in a rash while taking Taxol and this was stopped due to the rash before she could complete her planned course of therapy. She completed 6 weeks of radiation in 2012. She was placed on Letrozole and has been taking that since that time.  She will finish 5 years in August of 2017.   A year ago, she reports getting a rash on her right side and this was diagnosed and treated as Shingles and subsequently went away. She reports that she always received generic Letrozole from [facility]. She is not allergic to shellfish and did not consume any prior to this issue. She denies taking any new medications or travelling. She has a history of psoriasis and would apply hydrocortisone under the right breast where she has chronic psoriasis and this helped.  On 05/06/14, she had a fever and her right breast became red and tender 2 days later. She was treated with doxycycline and her breasts stayed swollen and  red. Eventually, the redness and pain lessened, but blisters remained.  She reports the rash would move down her right arm and wraps around her back.On 05/20/14, she had a needle and skin needle biopsy which was negative for malignancy. Between September and December, she had multiple urgent care visits for similar complaints. Each time she was prescribed doxycycline. Her PCP attributed this to psoriasis and referred her to dermatology. She went to a dermatologist who gave her a "strong" antibiotic and then doxycycline,. It got better, but then recurred on the doxycycline on 03/08/15. She got a second opinion of her pathology which was consistent with the first biopsy-- fibroadipose tissue with abscess and pleomorphic adipocytes. She had an MRI of the bilateral breasts in September which showed skin thickening and enhancement of the breast parenchyma, nipple and areola. Mammogram and ultrasound were negative..  Reports that her appetite good. Energy level good today and low other days. She has a history of psoriasis and feels the redness under the right breast is her psoriasis. She feels her breast is back to baseline now. She has been on a 2 weeks steroid taper after Dr. Grayland Ormond and I talked and this significantly improved her breast symptoms.  She attempted the taper but the symptoms would reappear anytime she took less than 20 mg. She therefore has been on 20 mg for about a month now.    She is tolerating the prednisone well with only side effects of increased appetite and weight gain.   PREVIOUS RADIATION THERAPY: Yes. 6 weeks  to the right breast with a boost at [facility] completed in 2012.  Past medical, social and family history were reviewed in the electronic chart. Review of symptoms was reviewed in the electronic chart. Medications were reviewed in the electronic chart.   PHYSICAL EXAM:  Filed Vitals:   07/22/15 0932  BP: 137/56  Pulse: 50  Temp: 97.7 F (36.5 C)  .310 lb 4.8 oz  (140.751 kg)  No palpable cervical, supraclavicular, or axillary adenopathy. No palpable abnormalities of the left breast. The right breast is smaller than the left and has skin thickening and edema in the inferior aspect of the breast. There are telangiectasias in the lateral aspect of the breast and the right axilla. There's an area of pocking at the 12 o'clock position of the right breast with associated palpable scar tissue which she states is stable. There is a psoriatic plaque like rash in the inframammary folds bilaterally. Alert and oriented x3. No lymphedema of the right arm.  IMPRESSION: 59 yo woman with Radiation Recall of the Right Breast.  PLAN: We spoke about the unclear etiology of radiation recall in breast cancer patients. She does not seem to have any triggering factors. Although she has an underlying skin disease with her psoriasis, which may be causing this over reaction. She is clearly responding well to steroids. It may be prudent to continue these steroids at the current dose of 20 mg a day as she has been unable to wean off without recurrence of the recall symptoms. She and her husband has a cruise scheduled at the end of February and is anxious about travelling out of the country and having an episode. She would prefer to continue the steroids until she gets back. She is on a low dose, so I think we could continue until early March when I will see her for a follow up. I also recommend stopping the Letrozole, since this has been associated with radiation recall in the literature. It's not clear that this is the case in her particular situation, but I would like to remove any confounding variables that may contribute to her recurring symptoms.   We discussed side effects of chronic steroid use including osteoporosis, easy bruising/bleeding, sun sensitivity, hypertension, diabetes and weight gain. At only 20 mg these effects should be uncommon and I feel we can leave her on through  February.   She has a follow up with Dr. Grayland Ormond next week. I plan on seeing her in March as we discussed or sooner if needed.  I spent 60 minutes  face to face with the patient and more than 50% of that time was spent in counseling and/or coordination of care.   ------------------------------------------------  Thea Silversmith, MD  This document serves as a record of services personally performed by Thea Silversmith, MD. It was created on her behalf by Darcus Austin, a trained medical scribe. The creation of this record is based on the scribe's personal observations and the provider's statements to them. This document has been checked and approved by the attending provider.

## 2015-07-22 NOTE — Progress Notes (Addendum)
Mrs, Vicks here today for consult.  Reports that her appetite good.  Energy level good today  and low other days.  Right breast and axilla has blistered areas  with redness.   This has been going on for a year per patient. She has a history of psoriasis feels the redness under the right breast is her psoriasis.    BP 137/56 mmHg  Pulse 50  Temp(Src) 97.7 F (36.5 C) (Oral)  Ht 5\' 6"  (1.676 m)  Wt 310 lb 4.8 oz (140.751 kg)  BMI 50.11 kg/m2  SpO2 97%  Wt Readings from Last 3 Encounters:  07/22/15 310 lb 4.8 oz (140.751 kg)  06/29/15 305 lb 8.9 oz (138.6 kg)  05/08/15 300 lb 7.8 oz (136.3 kg)

## 2015-07-31 ENCOUNTER — Telehealth: Payer: Self-pay | Admitting: Oncology

## 2015-07-31 ENCOUNTER — Other Ambulatory Visit: Payer: BLUE CROSS/BLUE SHIELD

## 2015-07-31 ENCOUNTER — Ambulatory Visit: Payer: BLUE CROSS/BLUE SHIELD | Admitting: Oncology

## 2015-07-31 NOTE — Telephone Encounter (Signed)
She said Dr. Lance Morin in [place] wants her to discontinue her hormones for a couple of months. Has she contacted you yet and is this okay with you? Please advise.

## 2015-07-31 NOTE — Telephone Encounter (Signed)
Left vm message for patient to call back.

## 2015-07-31 NOTE — Telephone Encounter (Signed)
Message forwarded to Dr. Grayland Ormond

## 2015-07-31 NOTE — Telephone Encounter (Signed)
I agree. Thanks.

## 2015-08-07 ENCOUNTER — Ambulatory Visit: Payer: BLUE CROSS/BLUE SHIELD | Admitting: Oncology

## 2015-08-07 ENCOUNTER — Other Ambulatory Visit: Payer: BLUE CROSS/BLUE SHIELD

## 2015-08-21 ENCOUNTER — Other Ambulatory Visit: Payer: BLUE CROSS/BLUE SHIELD

## 2015-08-21 ENCOUNTER — Ambulatory Visit: Payer: BLUE CROSS/BLUE SHIELD | Admitting: Oncology

## 2015-08-28 ENCOUNTER — Inpatient Hospital Stay (HOSPITAL_BASED_OUTPATIENT_CLINIC_OR_DEPARTMENT_OTHER): Payer: BLUE CROSS/BLUE SHIELD | Admitting: Oncology

## 2015-08-28 ENCOUNTER — Inpatient Hospital Stay: Payer: BLUE CROSS/BLUE SHIELD | Attending: Oncology

## 2015-08-28 VITALS — BP 123/85 | HR 57 | Temp 97.3°F | Wt 311.3 lb

## 2015-08-28 DIAGNOSIS — Z170 Estrogen receptor positive status [ER+]: Secondary | ICD-10-CM | POA: Insufficient documentation

## 2015-08-28 DIAGNOSIS — C50911 Malignant neoplasm of unspecified site of right female breast: Secondary | ICD-10-CM

## 2015-08-28 DIAGNOSIS — Z79899 Other long term (current) drug therapy: Secondary | ICD-10-CM

## 2015-08-28 DIAGNOSIS — M810 Age-related osteoporosis without current pathological fracture: Secondary | ICD-10-CM | POA: Insufficient documentation

## 2015-08-28 DIAGNOSIS — F329 Major depressive disorder, single episode, unspecified: Secondary | ICD-10-CM | POA: Diagnosis not present

## 2015-08-28 DIAGNOSIS — I10 Essential (primary) hypertension: Secondary | ICD-10-CM | POA: Diagnosis not present

## 2015-08-28 DIAGNOSIS — R21 Rash and other nonspecific skin eruption: Secondary | ICD-10-CM | POA: Diagnosis not present

## 2015-08-28 DIAGNOSIS — C50211 Malignant neoplasm of upper-inner quadrant of right female breast: Secondary | ICD-10-CM

## 2015-08-28 DIAGNOSIS — F419 Anxiety disorder, unspecified: Secondary | ICD-10-CM | POA: Diagnosis not present

## 2015-08-28 NOTE — Progress Notes (Signed)
Pt here for ongoing eval of Radiation recall rash to Right Breast; pt states rash has worsened over this week. She is taking 20mg  daily of prednisone which pt states was working to keep rash down. Pt wants MD to increase steroid dose today. She played cards this week, that movement of picking up cards and laying cards down  has caused more pain in her R breast.

## 2015-08-28 NOTE — Progress Notes (Signed)
[place]  [phone] [phone]  ID: Virgil Benedict OB: Oct 11, 1955  MR#: [id number]  EHM#:[id number]  Patient Care Team: Sherrin Daisy, MD as PCP - General (Family Medicine)  CHIEF COMPLAINT:  Chief Complaint  Patient presents with  . Follow-up    R Breast Cancer; Radiation Recall Rash    INTERVAL HISTORY: Patient returns to clinic today for further evaluation of her persistently erythematous right breast. Patient states her breast improved with 20 mg of steroids until Monday night when she was playing cards and her arms brushed up along the side of her breast causing a flare. She has continued on the steroids and started doxycycline again. She has not been taking letrozole for the past month. She does not complain of hot flashes today.  She denies any myalgias or arthralgias. She denies any neurological changes.  She denies any weakness or fatigue.  She has no chest pain, shortness of breath, or cough. Patient offers no further specific complaints today. She is going on a cruise leaving February 28th and returning March 5th.   REVIEW OF SYSTEMS:   Review of Systems  Constitutional: Negative.   Respiratory: Negative.   Gastrointestinal: Negative.   Musculoskeletal: Negative.   Skin: Positive for rash.       Erythema    As per HPI. Otherwise, a complete review of systems is negatve.  PAST MEDICAL HISTORY: Past Medical History  Diagnosis Date  . Breast cancer ([facility])   . Hypertension   . Depression   . Psoriasis     PAST SURGICAL HISTORY: Past Surgical History  Procedure Laterality Date  . Cholecystectomy      partial  . Appendectomy      FAMILY HISTORY: Reviewed and unchanged. No reported history of malignancy or chronic disease.     ADVANCED DIRECTIVES:    HEALTH MAINTENANCE: Social History  Substance Use Topics  . Smoking status: Never Smoker   . Smokeless tobacco: Never Used  . Alcohol Use: Yes     Comment:  occasional     Colonoscopy:  PAP:  Bone density:  Lipid panel:  Allergies  Allergen Reactions  . Cephalexin Rash  . Tape Itching    Current Outpatient Prescriptions  Medication Sig Dispense Refill  . Acetaminophen 500 MG coapsule Take 2 capsules by mouth every 6 (six) hours as needed.     . bisoprolol-hydrochlorothiazide (ZIAC) 5-6.25 MG per tablet TAKE 1 TABLET BY MOUTH EVERY DAY    . doxycycline (VIBRAMYCIN) 100 MG capsule Take 1 capsule (100 mg total) by mouth 2 (two) times daily. 20 capsule 0  . FLUoxetine (PROZAC) 10 MG capsule 10 mg.     . ibuprofen (ADVIL,MOTRIN) 200 MG tablet Take 800 mg by mouth every 8 (eight) hours as needed for moderate pain.    . predniSONE (DELTASONE) 20 MG tablet Take 1 tablet (20 mg total) by mouth daily with breakfast. 30 tablet 2  . letrozole (FEMARA) 2.5 MG tablet Take 1 tablet (2.5 mg total) by mouth daily. (Patient not taking: Reported on 08/28/2015) 90 tablet 4  . ondansetron (ZOFRAN-ODT) 8 MG disintegrating tablet Reported on 08/28/2015  0   No current facility-administered medications for this visit.    OBJECTIVE: Filed Vitals:   08/28/15 1157  BP: 123/85  Pulse: 57  Temp: 97.3 F (36.3 C)     Body mass index is 50.27 kg/(m^2).    ECOG FS:0 - Asymptomatic  General: Well-developed, well-nourished, no acute distress. Eyes: anicteric sclera. Breasts: Right  breast with mild erythema. Lungs: Clear to auscultation bilaterally. Heart: Regular rate and rhythm. No rubs, murmurs, or gallops. Abdomen: Soft, nontender, nondistended. No organomegaly noted, normoactive bowel sounds. Musculoskeletal: No edema, cyanosis, or clubbing. Neuro: Alert, answering all questions appropriately. Cranial nerves grossly intact. Skin: Active psoriasis on much of her torso, increased erythema of right breast. Psych: Normal affect.   LAB RESULTS:  No results found for: NA, K, CL, CO2, GLUCOSE, BUN, CREATININE, CALCIUM, PROT, ALBUMIN, AST, ALT, ALKPHOS,  BILITOT, GFRNONAA, GFRAA  No results found for: WBC, NEUTROABS, HGB, HCT, MCV, PLT   STUDIES: No results found.  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  PLAN:    1.  Breast cancer: Do not restart with Letrozole at this time.  August of 2017 will be 5 years from start of letrozole.  Patient's most recent mammogram and ultrasound on September 17, 2014 was reported as BI-RADS 2, repeat between March 5th and March 15th due to cruise.  She has a visit scheduled with radiation oncology on March 15th. Return to clinic on November 06, 2015 for further evaluation.  2. Osteoporosis: Bone mineral density on November 28, 2013 was reported as T score of -2.6, continue Fosamax as prescribed. Patient has been instructed to continue calcium and vitamin D.  Repeat Bone Density April 2017. 3.  Anxiety: Continue Prozac 20 mg as well as Xanax 0.25 mg as needed. 4.  Breast erythema: Recurrent. Unclear etiology. 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  After discussion with radiation oncology, the thought that this could possibly be a late incidence of radiation recall with an unknown contributing factor, possibly letrozole?  Patient states prednisone taper improved her erythema, but once her dose was below 20 mg her symptoms became worse again. She has been instructed to continue 20 mg daily until she is evaluated by radiation oncology in [place] on October 28, 2015.   Approximately 30 minutes was spent in discussion of which greater than 50% was consultation.  Patient expressed understanding and was in agreement with this plan. She also understands that She can call clinic at any time with any questions, concerns, or complaints.   Breast cancer   Staging form: Breast, AJCC 7th Edition     Clinical  stage from 01/26/2015: Stage IIA (T1c, N1, M0) - Signed by Lloyd Huger, MD on 01/26/2015   Mayra Reel, NP   08/28/2015 12:32 PM   Patient seen and evaluated independently and I agree with the assessment and plan as dictated above.  Lloyd Huger, MD 08/28/2015 2:40 PM

## 2015-08-29 LAB — CANCER ANTIGEN 27.29: CA 27.29: 7.5 U/mL (ref 0.0–38.6)

## 2015-10-21 ENCOUNTER — Other Ambulatory Visit: Payer: Self-pay | Admitting: Oncology

## 2015-10-21 ENCOUNTER — Telehealth: Payer: Self-pay | Admitting: Oncology

## 2015-10-21 DIAGNOSIS — C50911 Malignant neoplasm of unspecified site of right female breast: Secondary | ICD-10-CM

## 2015-10-21 NOTE — Telephone Encounter (Signed)
Done

## 2015-10-21 NOTE — Telephone Encounter (Signed)
Her cancer was diagnosed in 2011 and so she still requires diagnostic mammograms. Please change order to diagnostic bilateral with ultrasound limited of the right breast and ultrasound limited of the left breast. Thank you!

## 2015-10-22 ENCOUNTER — Inpatient Hospital Stay: Admission: RE | Admit: 2015-10-22 | Payer: Self-pay | Source: Ambulatory Visit | Admitting: Radiation Oncology

## 2015-10-22 ENCOUNTER — Ambulatory Visit: Payer: BLUE CROSS/BLUE SHIELD | Attending: Radiation Oncology | Admitting: Radiation Oncology

## 2015-10-22 ENCOUNTER — Ambulatory Visit: Payer: Self-pay | Admitting: Radiation Oncology

## 2015-10-28 ENCOUNTER — Ambulatory Visit
Admission: RE | Admit: 2015-10-28 | Discharge: 2015-10-28 | Disposition: A | Discharge status: Home / Self Care | Payer: BLUE CROSS/BLUE SHIELD | Source: Ambulatory Visit | Attending: Radiation Oncology | Admitting: Radiation Oncology

## 2015-10-29 ENCOUNTER — Ambulatory Visit
Admission: RE | Admit: 2015-10-29 | Discharge: 2015-10-29 | Disposition: A | Discharge status: Home / Self Care | Payer: BLUE CROSS/BLUE SHIELD | Source: Ambulatory Visit | Attending: Radiation Oncology | Admitting: Radiation Oncology

## 2015-10-29 ENCOUNTER — Encounter: Payer: Self-pay | Admitting: Radiation Oncology

## 2015-10-29 VITALS — BP 143/65 | HR 52 | Temp 97.6°F | Resp 18 | Ht 66.0 in | Wt 311.5 lb

## 2015-10-29 DIAGNOSIS — T66XXXA Radiation sickness, unspecified, initial encounter: Secondary | ICD-10-CM

## 2015-10-29 NOTE — Progress Notes (Signed)
Skin status: Rash and blisteres on right breast have improved since last visit in 07-22-15, taking Prednisone.  Still has some redness to her right breast.  Using Aveeno cream Lotion being used: Using Aveeno cream to right breast works the best does not increase the rash or blisters.  Have you seen your medical oncologist? Date If not ,when is appointment 11-06-15 Dr. Delight Hoh Mammogram scheduled 11-05-15 ER+,have started AI or Tamoxifen? If not, why? Not taking Letrozole reported not restarted August 2017 will be 5 years from start of Letrozole Discuss survivorship appointment:  Has spoken with a person over the telephone Offer referral to [facility] Not interested Appetite:Good Pain:None today Only has pain when the rash is bad or the blisters flare up Energy level:Good now BP 143/65 mmHg  Pulse 52  Temp(Src) 97.6 F (36.4 C) (Oral)  Resp 18  Ht 5\' 6"  (1.676 m)  Wt 311 lb 8 oz (141.295 kg)  BMI 50.30 kg/m2  SpO2 97%  Wt Readings from Last 3 Encounters:  10/29/15 311 lb 8 oz (141.295 kg)  08/28/15 311 lb 4.6 oz (141.2 kg)  07/22/15 310 lb 4.8 oz (140.751 kg)

## 2015-10-29 NOTE — Progress Notes (Signed)
   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  Summary and Interval since last radiation: She completed 6 weeks of radiation in 2012.  Interval History: Tiffany Wilson presents today for routine followup.    Skin status: Rash and blisters on right breast have improved since last visit in 07-22-15, taking Prednisone.  Still has some redness to her right breast.  Using Aveeno cream Lotion being used: Using Aveeno cream to right breast works the best does not increase the rash or blisters. She states she rarely gets rash/blister flare ups now and her pain is being managed well.   She had recently gone on a cruise with no issues and has scheduled for another one soon.  Have you seen your medical oncologist? Date If not ,when is appointment 11-06-15 Dr. Delight Hoh Mammogram scheduled 11-05-15 ER+,have started AI or Tamoxifen? If not, why? Not taking Letrozole reported not restarted August 2017 will be 5   Energy level:Good now BP 143/65 mmHg  Pulse 52  Temp(Src) 97.6 F (36.4 C) (Oral)  Resp 18  Ht '5\' 6"'$  (1.676 m)  Wt 311 lb 8 oz (141.295 kg)  BMI 50.30 kg/m2  SpO2 97%  Wt Readings from Last 3 Encounters:  10/29/15 311 lb 8 oz (141.295 kg)  08/28/15 311 lb 4.6 oz (141.2 kg)  07/22/15 310 lb 4.8 oz (140.751 kg)       Physical Exam:  Filed Vitals:   10/29/15 1035  BP: 143/65  Pulse: 52  Temp: 97.6 F (36.4 C)  TempSrc: Oral  Resp: 18  Height: '5\' 6"'$  (1.676 m)  Weight: 311 lb 8 oz (141.295 kg)  SpO2: 97%  The patient is alert and oriented on today's visit. Pink right breast with  redness in the inframammary folds consistent with psoriasis, no palpable mass or visible signs of tumor recurrence. Telangiectasias in the inframammary fold of the right breast  IMPRESSION: Tiffany Wilson is a 60 y.o. female who has been diagnosed with radiation recall of the right breast.  PLAN:  Follow up in two months. Start back on Letrozole. After upcoming mammogram, 11/05/15 and being on letrozole for 2 weeks, start slow taper (-'5mg'$ /2 weeks) of prednisone daily (2 weeks at '20mg'$ , then to 15 mg for 2 weeks, and so on).  ------------------------------------------------  Thea Silversmith, MD  This document serves as a record of services personally performed by Thea Silversmith, MD. It was created on her behalf by Derek Mound, a trained medical scribe. The creation of this record is based on the scribe's personal observations and the provider's statements to them. This document has been checked and approved by the attending provider.

## 2015-11-04 ENCOUNTER — Ambulatory Visit: Payer: BLUE CROSS/BLUE SHIELD

## 2015-11-04 ENCOUNTER — Other Ambulatory Visit: Payer: BLUE CROSS/BLUE SHIELD

## 2015-11-05 ENCOUNTER — Ambulatory Visit: Payer: BLUE CROSS/BLUE SHIELD

## 2015-11-05 ENCOUNTER — Other Ambulatory Visit: Payer: BLUE CROSS/BLUE SHIELD

## 2015-11-06 ENCOUNTER — Ambulatory Visit: Payer: BLUE CROSS/BLUE SHIELD | Admitting: Oncology

## 2015-11-23 ENCOUNTER — Other Ambulatory Visit: Payer: Self-pay | Admitting: Oncology

## 2015-11-23 ENCOUNTER — Ambulatory Visit
Admission: RE | Admit: 2015-11-23 | Discharge: 2015-11-23 | Disposition: A | Discharge status: Home / Self Care | Payer: BLUE CROSS/BLUE SHIELD | Source: Ambulatory Visit | Attending: Oncology | Admitting: Oncology

## 2015-11-23 DIAGNOSIS — C50911 Malignant neoplasm of unspecified site of right female breast: Secondary | ICD-10-CM

## 2015-11-23 DIAGNOSIS — Z853 Personal history of malignant neoplasm of breast: Secondary | ICD-10-CM | POA: Diagnosis not present

## 2015-12-11 ENCOUNTER — Inpatient Hospital Stay: Payer: BLUE CROSS/BLUE SHIELD | Attending: Oncology | Admitting: Oncology

## 2015-12-11 VITALS — BP 132/82 | HR 68 | Temp 97.8°F | Resp 18 | Wt 310.2 lb

## 2015-12-11 DIAGNOSIS — Z79899 Other long term (current) drug therapy: Secondary | ICD-10-CM | POA: Insufficient documentation

## 2015-12-11 DIAGNOSIS — Z170 Estrogen receptor positive status [ER+]: Secondary | ICD-10-CM | POA: Insufficient documentation

## 2015-12-11 DIAGNOSIS — I890 Lymphedema, not elsewhere classified: Secondary | ICD-10-CM | POA: Diagnosis not present

## 2015-12-11 DIAGNOSIS — F419 Anxiety disorder, unspecified: Secondary | ICD-10-CM | POA: Diagnosis not present

## 2015-12-11 DIAGNOSIS — I10 Essential (primary) hypertension: Secondary | ICD-10-CM | POA: Insufficient documentation

## 2015-12-11 DIAGNOSIS — C50911 Malignant neoplasm of unspecified site of right female breast: Secondary | ICD-10-CM | POA: Diagnosis not present

## 2015-12-11 DIAGNOSIS — M810 Age-related osteoporosis without current pathological fracture: Secondary | ICD-10-CM | POA: Insufficient documentation

## 2015-12-11 DIAGNOSIS — Z923 Personal history of irradiation: Secondary | ICD-10-CM | POA: Diagnosis not present

## 2015-12-11 NOTE — Progress Notes (Signed)
[place]  [phone] [phone]) [phone]  ID: Tiffany Wilson OB: May 16, 1956  MR#: [id number]  AJO#:[id number]  Patient Care Team: Sherrin Daisy, MD as PCP - General (Family Medicine)  CHIEF COMPLAINT:  No chief complaint on file.   INTERVAL HISTORY: Patient returns to clinic today for further evaluation of her persistently erythematous right breast. Patient went to her appointment at radiation on October 29, 2015. Her breast had cleared up at that time.  She was given instructions to go back on letrozole and taper steroids. She was on letrozole for 3 days and her breast flared up again. She immediately stopped letrozole. She went back up to the 20 mg steroid and started doxycycline. Her breast has cleared up significantly and is only mildly red. She is on day 8 of 14 days of doxy. She is going to start the steroid taper again tomorrow. Her next appointment with radiation is Dec 31, 2015. She also has noticed some swelling in her right arm. She otherwise feels well.   REVIEW OF SYSTEMS:   Review of Systems  Constitutional: Negative.   Respiratory: Negative.   Gastrointestinal: Negative.   Musculoskeletal: Negative.   Skin: Positive for rash.       Erythema    As per HPI. Otherwise, a complete review of systems is negatve.  PAST MEDICAL HISTORY: Past Medical History  Diagnosis Date  . Breast cancer ([place])   . Hypertension   . Depression   . Psoriasis     PAST SURGICAL HISTORY: Past Surgical History  Procedure Laterality Date  . Cholecystectomy      partial  . Appendectomy    . Breast lumpectomy Right     FAMILY HISTORY: Reviewed and unchanged. No reported history of malignancy or chronic disease.     ADVANCED DIRECTIVES:    HEALTH MAINTENANCE: Social History  Substance Use Topics  . Smoking status: Never Smoker   . Smokeless tobacco: Never Used  . Alcohol Use: Yes     Comment: occasional     Allergies  Allergen Reactions    . Cephalexin Rash  . Tape Itching    Current Outpatient Prescriptions  Medication Sig Dispense Refill  . Acetaminophen 500 MG coapsule Take 2 capsules by mouth every 6 (six) hours as needed.     . bisoprolol-hydrochlorothiazide (ZIAC) 5-6.25 MG per tablet TAKE 1 TABLET BY MOUTH EVERY DAY    . doxycycline (VIBRAMYCIN) 100 MG capsule Take 1 capsule (100 mg total) by mouth 2 (two) times daily. 20 capsule 0  . FLUoxetine (PROZAC) 10 MG capsule 10 mg.     . ibuprofen (ADVIL,MOTRIN) 200 MG tablet Take 800 mg by mouth every 8 (eight) hours as needed for moderate pain. Reported on 10/29/2015    . letrozole (FEMARA) 2.5 MG tablet Take 1 tablet (2.5 mg total) by mouth daily. (Patient not taking: Reported on 08/28/2015) 90 tablet 4  . ondansetron (ZOFRAN-ODT) 8 MG disintegrating tablet Reported on 10/29/2015  0  . predniSONE (DELTASONE) 20 MG tablet Take 1 tablet (20 mg total) by mouth daily with breakfast. 30 tablet 2   No current facility-administered medications for this visit.    OBJECTIVE: There were no vitals filed for this visit.   There is no weight on file to calculate BMI.    ECOG FS:0 - Asymptomatic  General: Well-developed, well-nourished, no acute distress. Eyes: anicteric sclera. Breasts: Right breast with mild erythema. Lungs: Clear to auscultation bilaterally. Heart: Regular rate and rhythm. No rubs, murmurs, or  gallops. Abdomen: Soft, nontender, nondistended. No organomegaly noted, normoactive bowel sounds. Musculoskeletal: No edema, cyanosis, or clubbing. Neuro: Alert, answering all questions appropriately. Cranial nerves grossly intact. Skin: Active psoriasis on much of her torso, increased erythema of right breast. Psych: Normal affect.   LAB RESULTS:  No results found for: NA, K, CL, CO2, GLUCOSE, BUN, CREATININE, CALCIUM, PROT, ALBUMIN, AST, ALT, ALKPHOS, BILITOT, GFRNONAA, GFRAA  No results found for: WBC, NEUTROABS, HGB, HCT, MCV, PLT   STUDIES: Mm Diag Breast  Tomo Bilateral  11/23/2015  CLINICAL DATA:  60 year old female with history of right breast lumpectomy in 2011 followed by radiation therapy. The patient has had chronic erythema of the right breast. She is being followed by Dr. Pablo Ledger for presumed radiation recall of the right breast. She had an outside biopsy of the right breast on 05/20/2014 with pathology results showing leiomyoma, and a second biopsy on the same date with pathology demonstrating fibroadipose tissue with abscess and pleomorphic adipose sites. Neoplasia was not excluded on the second biopsy pathology. MRI dated 04/22/2015 demonstrated diffuse skin thickening of the right breast and asymmetric nonspecific enhancement of the nipple and parenchyma of the right breast. Consideration of a consultation with breast surgery for possible nipple wedge resection or additional punch biopsy was recommended. The patient states that the skin changes of her right breast have improved after discontinuing letrozole. EXAM: 2D DIGITAL DIAGNOSTIC BILATERAL MAMMOGRAM WITH CAD AND ADJUNCT TOMO COMPARISON:  Previous exam(s). ACR Breast Density Category b: There are scattered areas of fibroglandular density. FINDINGS: Postlumpectomy changes are again noted of the right breast. When compared to prior mammograms, there has been increased skin thickening of the right breast, most prominent medially. The patient is being followed clinically by Dr. Pablo Ledger for a presumed diagnosis of radiation recall. No suspicious mass, calcifications, or nonsurgical distortion is identified within either breast. Mammographic images were processed with CAD. IMPRESSION: 1. Skin thickening of the right breast, most prominent medially. 2. No suspicious mass, calcifications, or nonsurgical distortion is identified within either breast to suggest malignancy. RECOMMENDATION: 1. Continued clinical management of the patient's right breast skin thickening. The patient states that she continues  to follow with Dr. Pablo Ledger. 2. Bilateral diagnostic mammogram in 1 year. I have discussed the findings and recommendations with the patient. Results were also provided in writing at the conclusion of the visit. If applicable, a reminder letter will be sent to the patient regarding the next appointment. BI-RADS CATEGORY  2: Benign. Electronically Signed   By: Pamelia Hoit M.D.   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  PLAN:    1.  Breast cancer:  Patient will discontinue letrozole at this time permanently. Her 5 years would have been finished in August 2017 and at this time she has completed 4+ years. Her mammogram in March 2017 showed right breast skin thickening but no suspicion for malignancy in either breast. Repeat May 2018.  CA 27.29 on August 28, 2015 was WNL at 7.5. RTC in 3 months for further evaluation. At that time, can possibly transition patient to 6 month intervals. 2. Osteoporosis: Bone mineral density on November 28, 2013 was reported as T score of -2.6, continue Fosamax as prescribed. Patient has been instructed to continue calcium and vitamin D twice daily.  Per patient request no more bone density scans due to insurance. 3.  Anxiety: Continue Prozac 20 mg as well as Xanax 0.25 mg as needed. 4.  Breast erythema: Discontinue letrozole as above. Continue follow up with radiation oncology. 5.  Lymphedema: Right arm. Patient refused referral to lymphedema clinic at this time.   Patient expressed understanding and was in agreement with this plan. She also understands that She can call clinic at any time with any questions, concerns, or complaints.   Breast cancer   Staging form: Breast, AJCC 7th Edition     Clinical stage from 01/26/2015: Stage IIA (T1c, N1, M0) - Signed by Lloyd Huger, MD on 01/26/2015   Mayra Reel, NP   12/11/2015 9:43 AM   Patient was seen and evaluated independently and I agree with  the assessment and plan as dictated above.  Lloyd Huger, MD 12/13/2015 7:18 AM

## 2015-12-31 ENCOUNTER — Ambulatory Visit: Payer: BLUE CROSS/BLUE SHIELD | Admitting: Radiation Oncology

## 2016-01-11 IMAGING — MR MR BREAST BILAT WO/W CM
8 of 13 series · 30 of 48 positions shown · IV contrast (20ml Multihance)
Comparison: No prior MRIs available for comparison. Correlation
made with bilateral mammograms including 09/17/2014 and earlier

ADDENDUM:
BIRADS correction: This should be BIRADS 4: Suspicious, as
malignancy cannot be excluded, not BIRADS 2 as stated in the
original report.
CLINICAL DATA: 59-year-old female with history of right breast
lumpectomy in 9366 followed by radiation therapy. The patient has
had chronic but worsening erythema of the right breast. Multiple
courses of antibiotics have temporarily improved the patient's
symptoms. She has had an outside biopsy of the right breast on
05/20/2014 with pathology results showing leiomyoma, and a second
biopsy on the same date with pathology demonstrating fibroadipose
tissue with abscess and pleomorphic adipose sites. Neoplasia was not
excluded on the second biopsy pathology. The inflammation of the
right breast has continued, without definite ideology.

LABS:  Creatinine were obtained on site at [HOSPITAL] at
[HOSPITAL].
Results: Creatinine 0.6 mg/dL.
EXAM:
BILATERAL BREAST MRI WITH AND WITHOUT CONTRAST
TECHNIQUE: Multiplanar, multisequence MR images of both breasts were obtained
prior to and following the intravenous administration of 20 ml of
MultiHance.

[Series 2: T2 · axial · 3.0mm · 0.94mm/px · z∈[-86,+91]mm · 3 of 60 slices shown]
[im 1/60]
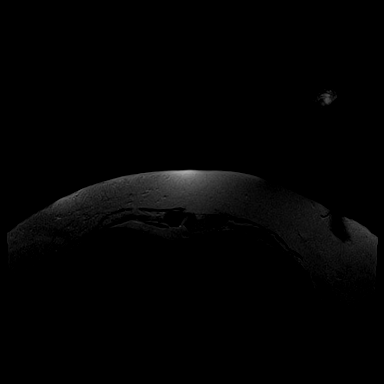
[im 30/60]
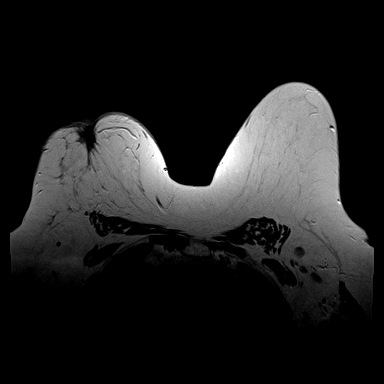
[im 60/60]
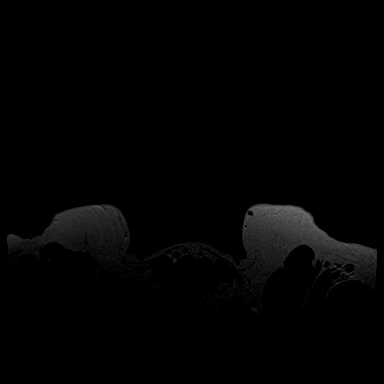

[Series 3: t2_tirm_tra ipat (a-p) · axial · 3.0mm · 0.70mm/px · z∈[-86,+91]mm · 2 of 60 slices shown]
[im 1/60]
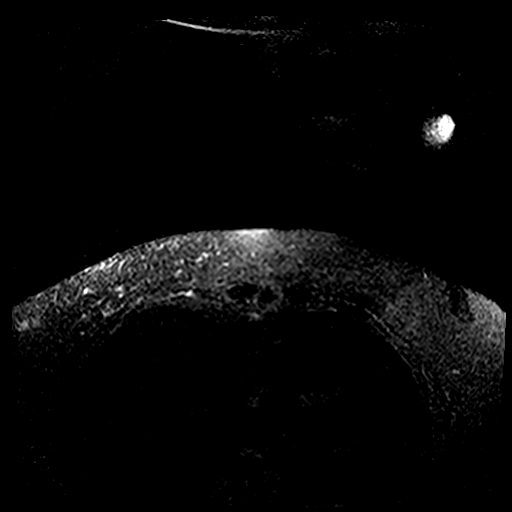
[im 60/60]
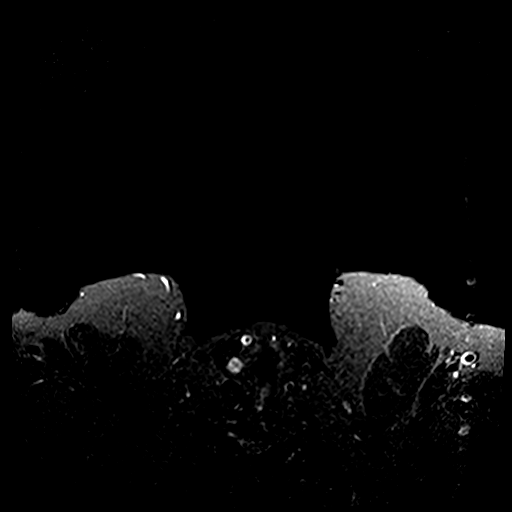

[Series 4: fl3d pre-cm no · axial · non-contrast · 1.2mm · 0.94mm/px · z∈[-83,+88]mm · 5 of 144 slices shown]
[im 1/144]
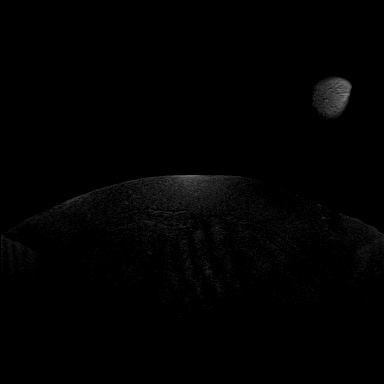
[im 36/144]
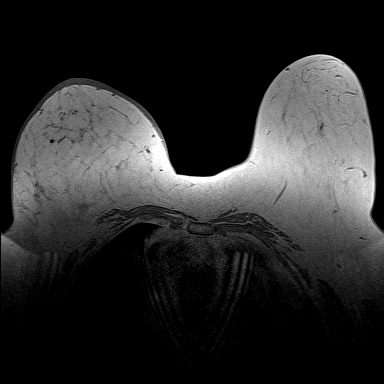
[im 72/144]
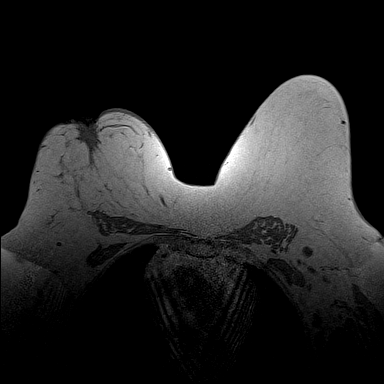
[im 108/144]
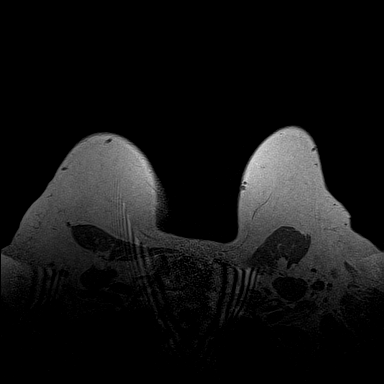
[im 144/144]
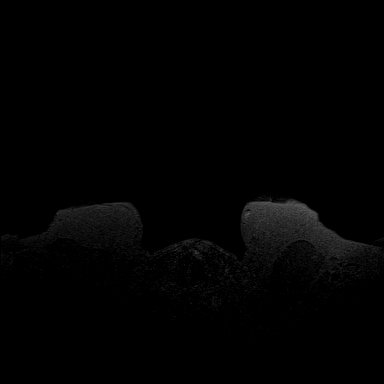

[Series 5: fl3d pre-cm · axial · non-contrast · 1.2mm · 0.94mm/px · z∈[-83,+88]mm · 5 of 144 slices shown]
[im 1/144]
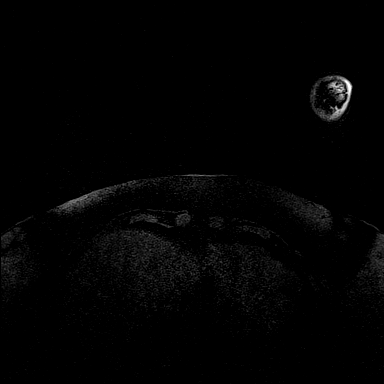
[im 36/144]
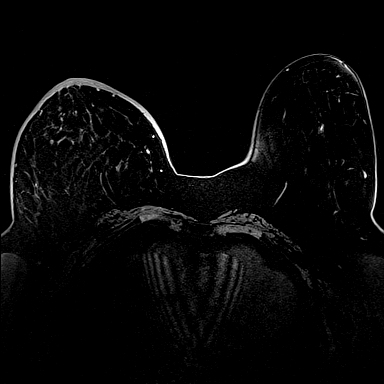
[im 72/144]
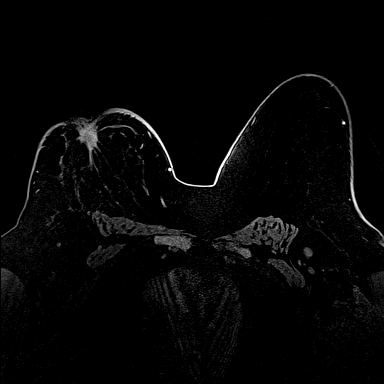
[im 108/144]
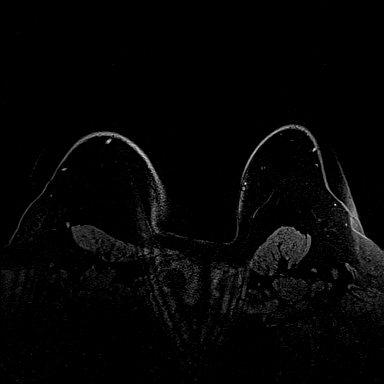
[im 144/144]
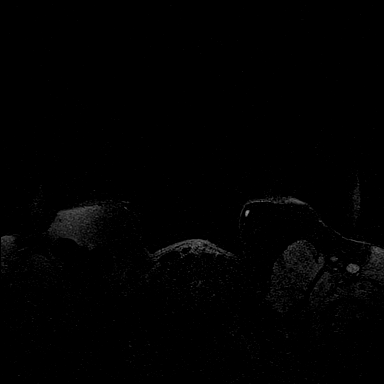

[Series 6: fl3d post-cm 20 · axial · 1.2mm · 0.94mm/px · z∈[-83,+88]mm · 5 of 144 slices shown (1 of 3)]
[im 1/144]
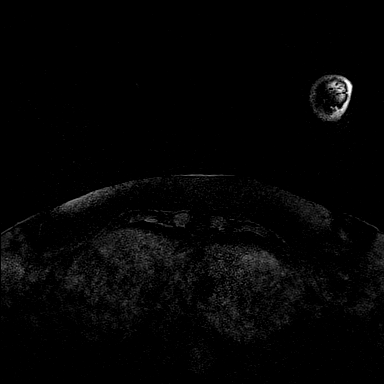
[im 36/144]
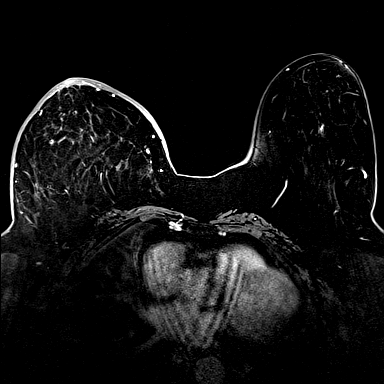
[im 72/144]
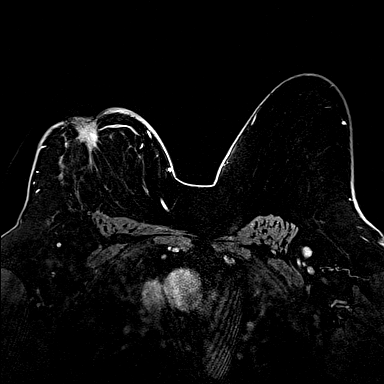
[im 108/144]
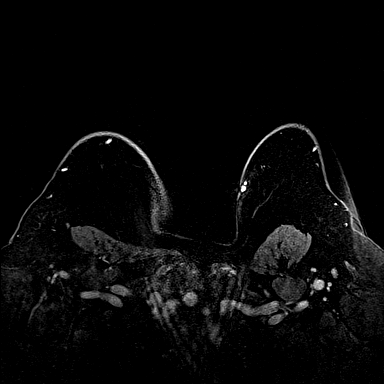
[im 144/144]
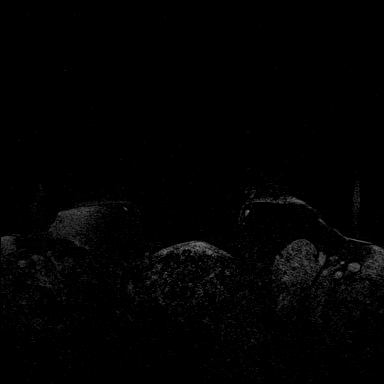

[Series 7: fl3d post-cm 20 · axial · 1.2mm · 0.94mm/px · z∈[-83,+88]mm · 5 of 144 slices shown (2 of 3)]
[im 1/144]
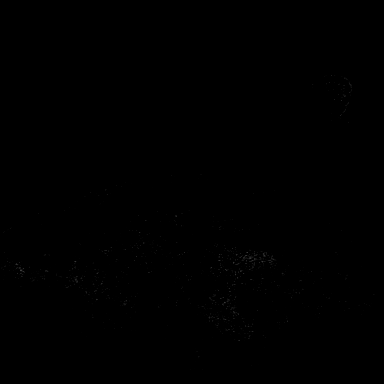
[im 36/144]
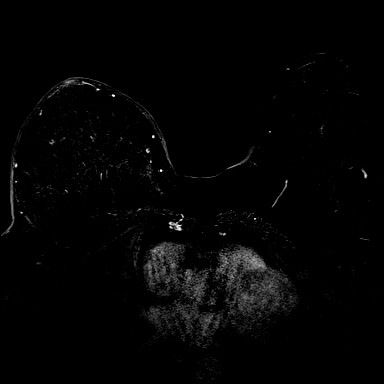
[im 72/144]
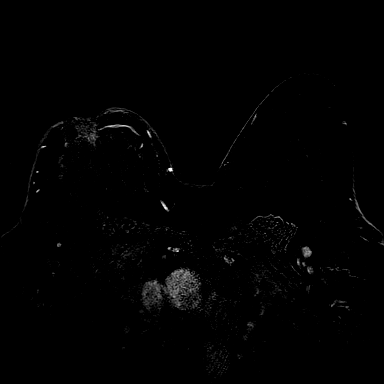
[im 108/144]
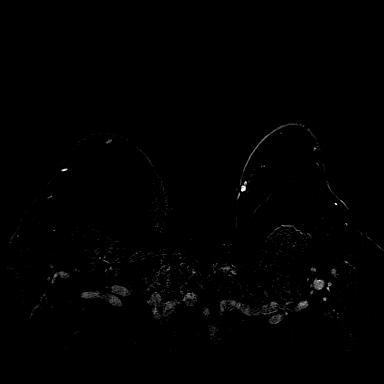
[im 144/144]
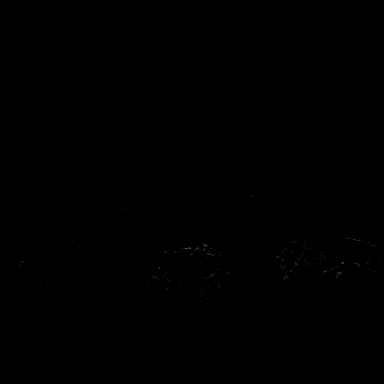

[Series 8: fl3d post-cm 20 · axial · 172.8mm · 0.94mm/px · 1 of 1 slices shown (3 of 3)]
[im 1/1]
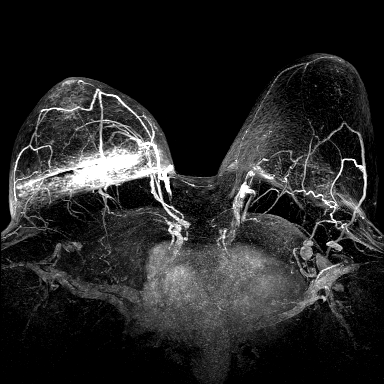

[Series 9: fl3d post-cm 3min · axial · 1.2mm · 0.94mm/px · z∈[-83,+45]mm · 4 of 144 slices shown]
[im 1/144]
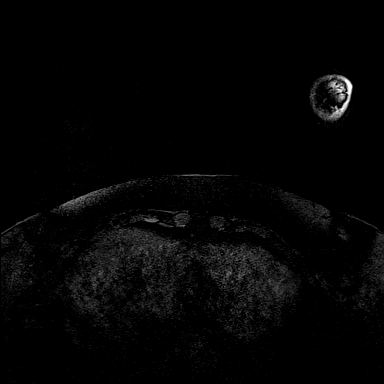
[im 36/144]
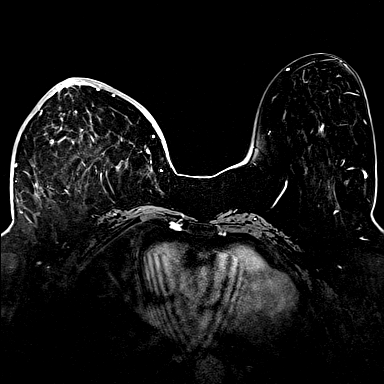
[im 72/144]
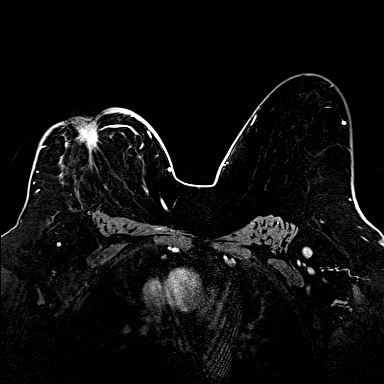
[im 108/144]
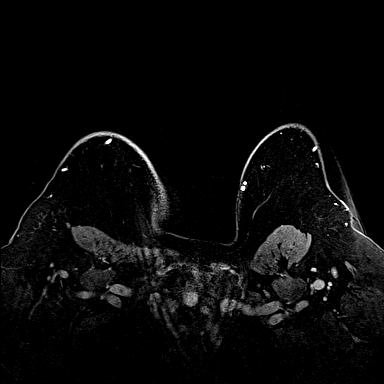

[30 of 48 positions shown; findings below may reference images not displayed]

THREE-DIMENSIONAL MR IMAGE RENDERING ON INDEPENDENT WORKSTATION:

Three-dimensional MR images were rendered by post-processing of the
original MR data on an independent workstation. The
three-dimensional MR images were interpreted, and findings are
reported in the following complete MRI report for this study. Three
dimensional images were evaluated at the independent [employer]
workstation
FINDINGS: Breast composition: b. Scattered fibroglandular tissue.

Background parenchymal enhancement: Mild.

Right breast: There is distortion and density in the superior right
breast, compatible with postlumpectomy changes. There is mildly
increased diffuse background parenchymal enhancement as compared to
the left breast, without focal enhancing mass or suspicious areas of
non mass enhancement. There is marked skin thickening. Enhancement
with persistent kinetics is noted in the right nipple/areola (series
7, image 115), which is asymmetric to the contralateral side.

Left breast: No mass or abnormal enhancement.

Lymph nodes: No abnormal lymph nodes identified.

Ancillary findings:  None.
IMPRESSION: 1. Marked diffuse skin thickening of the right breast, with
asymmetric mild diffuse parenchymal and nonspecific enhancement of
the nipple/areola. Differential considerations include mastitis
(infectious, inflammatory or "recall radiation"), Paget disease of
the nipple and occult inflammatory breast cancer.

2.  No MRI evidence of malignancy in the left breast.

RECOMMENDATION:
1. Consider consultation with breast surgery to evaluate for
possible nipple wedge resection and/or additional skin punch biopsy.

2. Consider consultation with Radiation Oncology for evaluation for
possible recall radiation mastitis.

Findings and recommendations were discussed with Dr. Alside on
04/22/2015.

BI-RADS CATEGORY  BI-RADS 2: Benign.

## 2016-01-21 NOTE — Progress Notes (Signed)
Mrs. is here  for follow up visit for malignant neoplasm of upper-inner quadrant of right female breast.  Skin status:Right breast with moist desquamation under the inframammary fold started yesterday. Lotion being used: Coco nut oil Have you seen your medical oncologist? Date If not ,when is appointment not schedule yet saw last 12-13-15 When is yiur next mammogram? Has it been scheduled ? : 11-23-15 ER+,have started AI or Tamoxifen? If not, why? Letrozole caused skin irritation was given Doxycycline and steroid.   Started Letrozole and had skin irritation again went back up to the 20 mg steroid and started doxycycline. Her breast has cleared up significantly and is only mildly red.  Has stopped Letrozole at this time permanently. Discuss survivorship appointment:     [facility]  Does not need to see Offer referral reading material for Survivorship, [facility] and [facility] given  N/A Appetite: Good Pain:No Arm mobility:Able to raise right arm without difficulty Fatigue:No         BP 129/51 mmHg  Pulse 51  Temp(Src) 98.8 F (37.1 C) (Oral)  Resp 20  Ht 5\' 6"  (1.676 m)  Wt 311 lb 6.4 oz (141.25 kg)  BMI 50.29 kg/m2  SpO2 98%

## 2016-01-28 ENCOUNTER — Ambulatory Visit
Admission: RE | Admit: 2016-01-28 | Discharge: 2016-01-28 | Disposition: A | Discharge status: Home / Self Care | Payer: BLUE CROSS/BLUE SHIELD | Source: Ambulatory Visit | Attending: Radiation Oncology | Admitting: Radiation Oncology

## 2016-01-28 VITALS — BP 129/51 | HR 51 | Temp 98.8°F | Resp 20 | Ht 66.0 in | Wt 311.4 lb

## 2016-01-28 DIAGNOSIS — C50911 Malignant neoplasm of unspecified site of right female breast: Secondary | ICD-10-CM | POA: Insufficient documentation

## 2016-01-28 DIAGNOSIS — Z923 Personal history of irradiation: Secondary | ICD-10-CM | POA: Diagnosis not present

## 2016-01-28 DIAGNOSIS — T66XXXA Radiation sickness, unspecified, initial encounter: Secondary | ICD-10-CM

## 2016-01-28 DIAGNOSIS — Z170 Estrogen receptor positive status [ER+]: Secondary | ICD-10-CM | POA: Insufficient documentation

## 2016-01-28 MED ORDER — DOXYCYCLINE HYCLATE 100 MG PO CAPS: 100.0000 mg | ORAL_CAPSULE | Freq: Two times a day (BID) | ORAL | Status: DC

## 2016-01-28 NOTE — Progress Notes (Signed)
   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  Summary and Interval since last radiation: She completed 6 weeks of radiation in 2012.  Interval History: Tiffany Wilson presents today for routine follow-up and is accompanied by her husband. She had a mammogram on April 10th which showed skin thickening with no suspicious masses or areas of concern. She started back on her letrozole, was on it for three days and her breast flared back up again. She immediately stopped her letrozole and went back up to 20 mg of prednisone and started doxycycline. Her breast cleared up almost immediately and she began her steroid taper again. She has had 3 other flareups since March all assocaited with discontinuing doxycycline. She saw Dr. Grayland Ormond in April and noted some right arm swelling but did not want to see physical therapy. She is scheduled to see him again in July.   She has been off her prednisone for about 1 week now. She has noticed her breast flaring up again today. Notes her breast flare up is not as bad today as she noticed it early. She attributes her breast flare ups to discontinuing the doxycycline. She reports a yellow-clear discharge from her breast. She denies any bowel issues while taking doxycycline. She has 1 doxycycline refill left and they are planning to go on a cruise in September.   Physical Exam:  Filed Vitals:   01/28/16 0855  BP: 129/51  Pulse: 51  Temp: 98.8 F (37.1 C)  TempSrc: Oral  Resp: 20  Height: '5\' 6"'$  (1.676 m)  Weight: 311 lb 6.4 oz (141.25 kg)  SpO2: 98%  The patient is alert and  oriented on today's visit. She has an area of bright red erythema in the inframmary fold up the lateral aspect of the right breast with a waxy appearance. The rest of the breast is pink. No abnormalities of the left breast.    IMPRESSION: Tiffany Wilson is a 60 y.o. female who has been diagnosed with radiation recall of the right breast.  PLAN:  I have encouraged the patient to continue maintenance doxycycline 100 mg bid for now. I encouraged her to try to taper the doxycycline as much as she could. We discussed side effects including sensitivity to sun exposure. I refilled her doxycycline today. She will return for follow up in 3 to 4 months. She is scheduled to see Dr. Grayland Ormond in July. She will not restart AI therapy and will be due for mammograms in April of 2018  ------------------------------------------------  Thea Silversmith, MD  This document serves as a record of services personally performed by Thea Silversmith, MD. It was created on her behalf by Arlyce Harman, a trained medical scribe. The creation of this record is based on the scribe's personal observations and the provider's statements to them. This document has been checked and approved by the attending provider.

## 2016-01-29 NOTE — Addendum Note (Signed)
Encounter addended by: Malena Edman, RN on: 01/29/2016 12:20 PM<BR>     Documentation filed: Charges VN

## 2016-03-11 ENCOUNTER — Ambulatory Visit: Payer: BLUE CROSS/BLUE SHIELD | Admitting: Oncology

## 2016-03-18 ENCOUNTER — Inpatient Hospital Stay: Payer: BLUE CROSS/BLUE SHIELD | Attending: Oncology | Admitting: Oncology

## 2016-03-18 VITALS — BP 163/76 | HR 59 | Temp 98.4°F | Resp 20 | Wt 312.8 lb

## 2016-03-18 DIAGNOSIS — M818 Other osteoporosis without current pathological fracture: Secondary | ICD-10-CM | POA: Diagnosis not present

## 2016-03-18 DIAGNOSIS — Z79899 Other long term (current) drug therapy: Secondary | ICD-10-CM | POA: Diagnosis not present

## 2016-03-18 DIAGNOSIS — C50411 Malignant neoplasm of upper-outer quadrant of right female breast: Secondary | ICD-10-CM | POA: Insufficient documentation

## 2016-03-18 DIAGNOSIS — I890 Lymphedema, not elsewhere classified: Secondary | ICD-10-CM

## 2016-03-18 DIAGNOSIS — L539 Erythematous condition, unspecified: Secondary | ICD-10-CM | POA: Diagnosis not present

## 2016-03-18 DIAGNOSIS — F419 Anxiety disorder, unspecified: Secondary | ICD-10-CM

## 2016-03-18 DIAGNOSIS — I10 Essential (primary) hypertension: Secondary | ICD-10-CM | POA: Diagnosis not present

## 2016-03-18 DIAGNOSIS — Z170 Estrogen receptor positive status [ER+]: Secondary | ICD-10-CM

## 2016-03-18 NOTE — Progress Notes (Signed)
Patient here today for routine follow up regarding breast cancer. Patient reports she is doing well, continues to take doxycycline. Patient is not taking Prednisone or Letrozole any longer.

## 2016-03-18 NOTE — Progress Notes (Signed)
[place]  Telephone:([phone]) [phone] [phone]  ID: Tiffany Wilson OB: 1955-12-15  MR#: [id number]  TFT#:[id number]  Patient Care Team: Sherrin Daisy, MD as PCP - General (Family Medicine)  CHIEF COMPLAINT: Stage IIa, ER/PR positive, HER-2 negative adenocarcinoma of the upper outer quadrant of right breast.  INTERVAL HISTORY: Patient returns to clinic today for further evaluation of her persistently erythematous right breast. She is now on doxycycline chronically and has recently finished a prednisone taper. She currently feels well and is asymptomatic. She has no neurologic complaints. She denies any recent fevers or illnesses. She has good appetite and denies weight loss. She has no chest pain or shortness of breath. She denies any nausea, vomiting, constipation, or diarrhea. She has no urinary complaints. Patient offers no further specific complaints today.  REVIEW OF SYSTEMS:   Review of Systems  Constitutional: Negative.  Negative for fever, malaise/fatigue and weight loss.  Respiratory: Negative.  Negative for cough and shortness of breath.   Cardiovascular: Negative.  Negative for chest pain.  Gastrointestinal: Negative.  Negative for abdominal pain.  Genitourinary: Negative.   Musculoskeletal: Negative.   Skin: Positive for rash.       Erythema  Neurological: Negative.  Negative for weakness.  Psychiatric/Behavioral: Negative.     As per HPI. Otherwise, a complete review of systems is negatve.  PAST MEDICAL HISTORY: Past Medical History:  Diagnosis Date  . Breast cancer ([place])   . Depression   . Hypertension   . Psoriasis     PAST SURGICAL HISTORY: Past Surgical History:  Procedure Laterality Date  . APPENDECTOMY    . BREAST LUMPECTOMY Right   . CHOLECYSTECTOMY     partial    FAMILY HISTORY: Reviewed and unchanged. No reported history of malignancy or chronic disease.     ADVANCED DIRECTIVES:    HEALTH MAINTENANCE: Social  History  Substance Use Topics  . Smoking status: Never Smoker  . Smokeless tobacco: Never Used  . Alcohol use Yes     Comment: occasional     Allergies  Allergen Reactions  . Cephalexin Rash  . Tape Itching    Current Outpatient Prescriptions  Medication Sig Dispense Refill  . Acetaminophen 500 MG coapsule Take 2 capsules by mouth every 6 (six) hours as needed.     . bisoprolol-hydrochlorothiazide (ZIAC) 5-6.25 MG per tablet TAKE 1 TABLET BY MOUTH EVERY DAY    . doxycycline (VIBRAMYCIN) 100 MG capsule Take 1 capsule (100 mg total) by mouth 2 (two) times daily. 60 capsule 5  . FLUoxetine (PROZAC) 10 MG capsule 10 mg.     . ibuprofen (ADVIL,MOTRIN) 200 MG tablet Take 800 mg by mouth every 8 (eight) hours as needed for moderate pain. Reported on 10/29/2015    . ondansetron (ZOFRAN-ODT) 8 MG disintegrating tablet Reported on 01/28/2016  0   No current facility-administered medications for this visit.     OBJECTIVE: Vitals:   03/18/16 1442  BP: (!) 163/76  Pulse: (!) 59  Resp: 20  Temp: 98.4 F (36.9 C)     Body mass index is 50.49 kg/m.    ECOG FS:0 - Asymptomatic  General: Well-developed, well-nourished, no acute distress. Eyes: anicteric sclera. Breasts: Right breast with mild erythema. Lungs: Clear to auscultation bilaterally. Heart: Regular rate and rhythm. No rubs, murmurs, or gallops. Abdomen: Soft, nontender, nondistended. No organomegaly noted, normoactive bowel sounds. Musculoskeletal: No edema, cyanosis, or clubbing. Neuro: Alert, answering all questions appropriately. Cranial nerves grossly intact. Skin: Active psoriasis on much  of her torso, increased erythema of right breast. Psych: Normal affect.   LAB RESULTS:  No results found for: NA, K, CL, CO2, GLUCOSE, BUN, CREATININE, CALCIUM, PROT, ALBUMIN, AST, ALT, ALKPHOS, BILITOT, GFRNONAA, GFRAA  No results found for: WBC, NEUTROABS, HGB, HCT, MCV, PLT   STUDIES: No results found.  ASSESSMENT: Stage  IIa, ER/PR positive, HER-2 negative adenocarcinoma of the upper outer quadrant of right breast.  PLAN:    1.  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 Her 5 years would have been finished in August 2017 and at this time she has completed 4+ years. Her mammogram in May 2017 showed right breast skin thickening but no suspicion for malignancy in either breast. Repeat May 2018.  CA-27-29 continues to be within normal limits. Return to clinic in 6 months for further evaluation. 2. Osteoporosis: Bone mineral density on November 28, 2013 was reported as T score of -2.6, continue Fosamax as prescribed. Patient has been instructed to continue calcium and vitamin D twice daily.  Per patient request no more bone density scans due to insurance. 3.  Anxiety: Continue Prozac 20 mg as well as Xanax 0.25 mg as needed. 4.  Breast erythema: Discontinue letrozole as above. Patient currently is taking 100 mg doxycycline twice a day. She has been instructed to attempt to taper this to once per day. She also has a standing order of prednisone 20 mg tabs which she has been instructed to take as needed when she has a flare of her erythema and pain of her right breast. She understands that she should not take prednisone chronically and only intermittently.  5.  Lymphedema: Right arm. Patient refused referral to lymphedema clinic at this time.   Patient expressed understanding and was in agreement with this plan. She also understands that She can call clinic at any time with any questions, concerns, or complaints.   Breast cancer   Staging form: Breast, AJCC 7th Edition     Clinical stage from 01/26/2015: Stage IIA (T1c, N1, M0) - Signed by Lloyd Huger, MD on 01/26/2015   Lloyd Huger, MD   03/18/2016 6:38 PM

## 2016-05-05 ENCOUNTER — Ambulatory Visit: Payer: Self-pay | Admitting: Radiation Oncology

## 2016-06-02 ENCOUNTER — Ambulatory Visit
Admission: EM | Admit: 2016-06-02 | Discharge: 2016-06-02 | Disposition: A | Discharge status: Home / Self Care | Payer: BLUE CROSS/BLUE SHIELD | Attending: Family Medicine | Admitting: Family Medicine

## 2016-06-02 DIAGNOSIS — H9201 Otalgia, right ear: Secondary | ICD-10-CM | POA: Diagnosis not present

## 2016-06-02 DIAGNOSIS — H6121 Impacted cerumen, right ear: Secondary | ICD-10-CM

## 2016-06-02 NOTE — ED Triage Notes (Signed)
Patient complains of right ear pain, pressure and fullness. Patient also complains of sinus pain and pressure x 1 week.

## 2016-06-02 NOTE — ED Provider Notes (Signed)
[facility]    CSN: [id number] Arrival date & time: 06/02/16  [id number]     History   Chief Complaint Chief Complaint  Patient presents with  . Otalgia    right    HPI Azaleia Arrellano Amadi is a 60 y.o. female.   Patient is here because of pain in the right ear. She states the pain started last week Saturday and has continued and gotten worse. She hasn't had trouble with the ears like this before. She reports no pain in the left ear. Past medical history she is a breast cancer survivor for 6 years she's had recurrent inflammatory breast lesions which she says is combination from chemotherapy and radiation they finally figured out. She has hypertension  and depression. She's had breast lumpectomy cholecystectomy and appendectomy.   The history is provided by the patient. No language interpreter was used.  Otalgia  Location:  Right Behind ear:  No abnormality Quality:  Pressure and sharp Severity:  Severe Onset quality:  Sudden Timing:  Constant Progression:  Worsening Chronicity:  New Relieved by:  Nothing Worsened by:  Nothing Associated symptoms: no abdominal pain, no congestion, no cough, no diarrhea, no ear discharge, no fever, no headaches, no hearing loss, no neck pain, no rash, no rhinorrhea and no sore throat   Risk factors: no chronic ear infection and no prior ear surgery     Past Medical History:  Diagnosis Date  . Breast cancer ([place])   . Depression   . Hypertension   . Psoriasis     Patient Active Problem List   Diagnosis Date Noted  . Breast cancer of upper-outer quadrant of right female breast ([place]) 01/26/2015  . Clinical depression 03/06/2014  . BP (high blood pressure) 03/06/2014  . Adiposity 03/06/2014  . Psoriasis 03/06/2014    Past Surgical History:  Procedure Laterality Date  . APPENDECTOMY    . BREAST LUMPECTOMY Right   . CHOLECYSTECTOMY     partial    OB History    No data available       Home Medications    Prior to  Admission medications   Medication Sig Start Date End Date Taking? Authorizing Provider  Acetaminophen 500 MG coapsule Take 2 capsules by mouth every 6 (six) hours as needed.    Yes Historical Provider, MD  bisoprolol-hydrochlorothiazide (ZIAC) 5-6.25 MG per tablet TAKE 1 TABLET BY MOUTH EVERY DAY 01/07/15  Yes Historical Provider, MD  doxycycline (VIBRAMYCIN) 100 MG capsule Take 1 capsule (100 mg total) by mouth 2 (two) times daily. 01/28/16  Yes Thea Silversmith, MD  FLUoxetine (PROZAC) 10 MG capsule 10 mg.  12/31/14  Yes Historical Provider, MD  predniSONE (DELTASONE) 20 MG tablet Take 20 mg by mouth daily with breakfast.   Yes Historical Provider, MD  ibuprofen (ADVIL,MOTRIN) 200 MG tablet Take 800 mg by mouth every 8 (eight) hours as needed for moderate pain. Reported on 10/29/2015    Historical Provider, MD  ondansetron (ZOFRAN-ODT) 8 MG disintegrating tablet Reported on 01/28/2016 11/10/14   Historical Provider, MD    Family History Family History  Problem Relation Age of Onset  . Breast cancer Mother     Social History Social History  Substance Use Topics  . Smoking status: Never Smoker  . Smokeless tobacco: Never Used  . Alcohol use Yes     Comment: occasional     Allergies   Cephalexin and Tape   Review of Systems Review of Systems  Constitutional: Negative for fever.  HENT: Positive for ear pain. Negative for congestion, ear discharge, hearing loss, rhinorrhea and sore throat.   Respiratory: Negative for cough.   Gastrointestinal: Negative for abdominal pain and diarrhea.  Musculoskeletal: Negative for neck pain.  Skin: Negative for rash.  Neurological: Negative for headaches.  All other systems reviewed and are negative.    Physical Exam Triage Vital Signs ED Triage Vitals  Enc Vitals Group     BP 06/02/16 1843 (!) 150/72     Pulse Rate 06/02/16 1843 69     Resp 06/02/16 1843 17     Temp 06/02/16 1843 97.8 F (36.6 C)     Temp Source 06/02/16 1843 Tympanic      SpO2 06/02/16 1843 97 %     Weight 06/02/16 1840 (!) 320 lb (145.2 kg)     Height 06/02/16 1840 5\' 6"  (1.676 m)     Head Circumference --      Peak Flow --      Pain Score 06/02/16 1843 5     Pain Loc --      Pain Edu? --      Excl. in [place]? --    No data found.   Updated Vital Signs BP (!) 150/72 (BP Location: Left Arm)   Pulse 69   Temp 97.8 F (36.6 C) (Tympanic)   Resp 17   Ht 5\' 6"  (1.676 m)   Wt (!) 320 lb (145.2 kg)   SpO2 97%   BMI 51.65 kg/m   Visual Acuity Right Eye Distance:   Left Eye Distance:   Bilateral Distance:    Right Eye Near:   Left Eye Near:    Bilateral Near:     Physical Exam  Constitutional: She is oriented to person, place, and time. She appears well-developed and well-nourished. No distress.  HENT:  Head: Normocephalic and atraumatic.  Right Ear: Hearing and external ear normal. A foreign body is present.  Left Ear: Hearing, tympanic membrane, external ear and ear canal normal.  Nose: No mucosal edema, rhinorrhea or nose lacerations. Right sinus exhibits no maxillary sinus tenderness and no frontal sinus tenderness. Left sinus exhibits no maxillary sinus tenderness and no frontal sinus tenderness.  Eyes: Pupils are equal, round, and reactive to light.  Neck: Normal range of motion.  Pulmonary/Chest: Breath sounds normal.  Musculoskeletal: Normal range of motion.  Neurological: She is alert and oriented to person, place, and time. She has normal reflexes.  Skin: Skin is warm. She is not diaphoretic.  Psychiatric: She has a normal mood and affect.  Vitals reviewed.    UC Treatments / Results  Labs (all labs ordered are listed, but only abnormal results are displayed) Labs Reviewed - No data to display  EKG  EKG Interpretation None       Radiology No results found.  Procedures Procedures (including critical care time)  Medications Ordered in UC Medications - No data to display   Initial Impression / Assessment and Plan  / UC Course  I have reviewed the triage vital signs and the nursing notes.  Pertinent labs & imaging results that were available during my care of the patient were reviewed by me and considered in my medical decision making (see chart for details).  Clinical Course  Patient had the right ear irrigated with large amount of wax removed patient tolerated quite well very appreciative of care. She agrees she does not need any antibiotics at this time. Final Clinical Impressions(s) / UC Diagnoses   Final diagnoses:  Impacted cerumen of right ear  Right ear pain    New Prescriptions Discharge Medication List as of 06/02/2016  7:13 PM       Frederich Cha, MD 06/02/16 [id number]

## 2016-06-05 ENCOUNTER — Telehealth: Payer: Self-pay | Admitting: *Deleted

## 2016-07-20 ENCOUNTER — Other Ambulatory Visit: Payer: Self-pay | Admitting: *Deleted

## 2016-07-20 MED ORDER — PREDNISONE 20 MG PO TABS: 20.0000 mg | ORAL_TABLET | Freq: Every day | ORAL | 1 refills | Status: DC

## 2016-08-13 ENCOUNTER — Other Ambulatory Visit: Payer: Self-pay | Admitting: Radiation Oncology

## 2016-08-19 ENCOUNTER — Other Ambulatory Visit: Payer: Self-pay | Admitting: *Deleted

## 2016-08-19 ENCOUNTER — Telehealth: Payer: Self-pay | Admitting: Oncology

## 2016-08-19 MED ORDER — DOXYCYCLINE HYCLATE 100 MG PO CAPS: 100.0000 mg | ORAL_CAPSULE | Freq: Two times a day (BID) | ORAL | 5 refills | Status: DC

## 2016-08-19 NOTE — Telephone Encounter (Signed)
Patients prescription for doxycycline sent to her pharmacy. Patients mammogram is due in April 2018, can be ordered at next follow up appointment scheduled in February 2018.

## 2016-08-19 NOTE — Telephone Encounter (Signed)
Thank you :)

## 2016-08-19 NOTE — Telephone Encounter (Signed)
Pt said pharmacy has been trying to reach Tiffany Wilson about Doxy refill for a week. She had been seeing Dr. Pablo Ledger who was prescribing. [name] told her he would continue it for her since Dr. Pablo Ledger has left. Also she thinks she needs a mammo next month. If you need to call her: [phone].

## 2016-09-23 ENCOUNTER — Ambulatory Visit: Payer: BLUE CROSS/BLUE SHIELD | Admitting: Oncology

## 2016-09-28 NOTE — Progress Notes (Signed)
[place]  Telephone:([phone] Fax:(336) [phone]  ID: Virgil Benedict OB: June 27, 1956  MR#: [id number]  HWE#:[id number]  Patient Care Team: Sherrin Daisy, MD as PCP - General (Family Medicine)  CHIEF COMPLAINT: Stage IIa, ER/PR positive, HER-2 negative adenocarcinoma of the upper outer quadrant of right breast.  INTERVAL HISTORY: Patient returns to clinic today for routine follow-up. She continues to have a persistently erythematous right breast. She is now on doxycycline 2 times daily as well as 10 mg prednisone daily. Patient states when she decreases her steroid below 10 mg, she has a significant flare on her breast. She has no neurologic complaints. She denies any recent fevers or illnesses. She has good appetite and denies weight loss. She has no chest pain or shortness of breath. She denies any nausea, vomiting, constipation, or diarrhea. She has no urinary complaints. Patient offers no further specific complaints today.  REVIEW OF SYSTEMS:   Review of Systems  Constitutional: Negative.  Negative for fever, malaise/fatigue and weight loss.  Respiratory: Negative.  Negative for cough and shortness of breath.   Cardiovascular: Negative.  Negative for chest pain.  Gastrointestinal: Negative.  Negative for abdominal pain.  Genitourinary: Negative.   Musculoskeletal: Negative.   Skin: Positive for rash.       Erythema  Neurological: Negative.  Negative for weakness.  Psychiatric/Behavioral: Negative.     As per HPI. Otherwise, a complete review of systems is negatve.  PAST MEDICAL HISTORY: Past Medical History:  Diagnosis Date  . Breast cancer ([place])   . Depression   . Hypertension   . Psoriasis     PAST SURGICAL HISTORY: Past Surgical History:  Procedure Laterality Date  . APPENDECTOMY    . BREAST LUMPECTOMY Right   . CHOLECYSTECTOMY     partial    FAMILY HISTORY: Reviewed and unchanged. No reported history of malignancy or chronic  disease.     ADVANCED DIRECTIVES:    HEALTH MAINTENANCE: Social History  Substance Use Topics  . Smoking status: Never Smoker  . Smokeless tobacco: Never Used  . Alcohol use Yes     Comment: occasional     Allergies  Allergen Reactions  . Cephalexin Rash  . Tape Itching    Current Outpatient Prescriptions  Medication Sig Dispense Refill  . Acetaminophen 500 MG coapsule Take 2 capsules by mouth every 6 (six) hours as needed.     . bisoprolol-hydrochlorothiazide (ZIAC) 5-6.25 MG per tablet TAKE 1 TABLET BY MOUTH EVERY DAY    . doxycycline (VIBRAMYCIN) 100 MG capsule Take 1 capsule (100 mg total) by mouth 2 (two) times daily. 60 capsule 5  . FLUoxetine (PROZAC) 10 MG capsule 10 mg.     . ondansetron (ZOFRAN-ODT) 8 MG disintegrating tablet Reported on 01/28/2016  0  . predniSONE (DELTASONE) 20 MG tablet Take 1 tablet (20 mg total) by mouth daily with breakfast. 30 tablet 1  . ibuprofen (ADVIL,MOTRIN) 200 MG tablet Take 800 mg by mouth every 8 (eight) hours as needed for moderate pain. Reported on 10/29/2015     No current facility-administered medications for this visit.     OBJECTIVE: Vitals:   09/30/16 1014  BP: 122/75  Pulse: (!) 54  Temp: (!) 96.4 F (35.8 C)     There is no height or weight on file to calculate BMI.    ECOG FS:0 - Asymptomatic  General: Well-developed, well-nourished, no acute distress. Eyes: anicteric sclera. Breasts: Right breast with mild erythema. Lungs: Clear to auscultation bilaterally. Heart: Regular  rate and rhythm. No rubs, murmurs, or gallops. Abdomen: Soft, nontender, nondistended. No organomegaly noted, normoactive bowel sounds. Musculoskeletal: No edema, cyanosis, or clubbing. Neuro: Alert, answering all questions appropriately. Cranial nerves grossly intact. Skin: Active psoriasis on much of her torso, increased erythema of right breast. Psych: Normal affect.   LAB RESULTS:  No results found for: NA, K, CL, CO2, GLUCOSE, BUN,  CREATININE, CALCIUM, PROT, ALBUMIN, AST, ALT, ALKPHOS, BILITOT, GFRNONAA, GFRAA  No results found for: WBC, NEUTROABS, HGB, HCT, MCV, PLT   STUDIES: No results found.  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  PLAN:    1. 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 Her 5 years would have been finished in August 2017 and at this time she has completed 4+ years. Her mammogram in April 2017 showed right breast skin thickening but no suspicion for malignancy in either breast. Repeat in April 2018.  CA-27-29 continues to be within normal limits. Return to clinic in 6 months for further evaluation. 2. Osteoporosis: Bone mineral density on November 28, 2013 was reported as T score of -2.6, continue Fosamax as prescribed. Patient has been instructed to continue calcium and vitamin D twice daily.  Per patient request no more bone density scans due to insurance. 3.  Anxiety: Continue Prozac 20 mg as well as Xanax 0.25 mg as needed. 4.  Breast erythema: Discontinue letrozole as above. Patient currently is taking 100 mg doxycycline twice a day. She has been instructed to attempt to taper this to once per day. She takes 10 mg prednisone daily. Patient states that she drops her steroids below this, she has a significant flare in her breast and is hesitant to decrease any further. She expressed understanding of the risks of chronic steroid use.  5.  Lymphedema: Right arm. Patient refused referral to lymphedema clinic at this time.   Patient expressed understanding and was in agreement with this plan. She also understands that She can call clinic at any time with any questions, concerns, or complaints.   Breast cancer   Staging form: Breast, AJCC 7th Edition     Clinical stage from 01/26/2015: Stage IIA (T1c, N1, M0) - Signed by Lloyd Huger, MD on 01/26/2015   Lloyd Huger, MD   10/02/2016 8:41 AM

## 2016-09-30 ENCOUNTER — Inpatient Hospital Stay: Payer: BLUE CROSS/BLUE SHIELD | Attending: Oncology | Admitting: Oncology

## 2016-09-30 VITALS — BP 122/75 | HR 54 | Temp 96.4°F

## 2016-09-30 DIAGNOSIS — L539 Erythematous condition, unspecified: Secondary | ICD-10-CM | POA: Insufficient documentation

## 2016-09-30 DIAGNOSIS — F419 Anxiety disorder, unspecified: Secondary | ICD-10-CM | POA: Insufficient documentation

## 2016-09-30 DIAGNOSIS — Z9223 Personal history of estrogen therapy: Secondary | ICD-10-CM | POA: Diagnosis not present

## 2016-09-30 DIAGNOSIS — Z853 Personal history of malignant neoplasm of breast: Secondary | ICD-10-CM | POA: Diagnosis not present

## 2016-09-30 DIAGNOSIS — I10 Essential (primary) hypertension: Secondary | ICD-10-CM | POA: Diagnosis not present

## 2016-09-30 DIAGNOSIS — C50411 Malignant neoplasm of upper-outer quadrant of right female breast: Secondary | ICD-10-CM

## 2016-09-30 DIAGNOSIS — Z79899 Other long term (current) drug therapy: Secondary | ICD-10-CM | POA: Diagnosis not present

## 2016-09-30 DIAGNOSIS — M818 Other osteoporosis without current pathological fracture: Secondary | ICD-10-CM | POA: Insufficient documentation

## 2016-09-30 DIAGNOSIS — I890 Lymphedema, not elsewhere classified: Secondary | ICD-10-CM | POA: Diagnosis not present

## 2016-09-30 DIAGNOSIS — Z170 Estrogen receptor positive status [ER+]: Secondary | ICD-10-CM | POA: Diagnosis not present

## 2016-09-30 NOTE — Progress Notes (Signed)
Here for Routine Follow up for Right Breast cancer. Continues to have Rash/Blisters from radiation recall. Continues to have psoriasis as well.

## 2016-10-21 ENCOUNTER — Telehealth: Payer: Self-pay

## 2016-10-21 NOTE — Telephone Encounter (Signed)
Advised patient we could not get her a letter

## 2016-11-24 ENCOUNTER — Other Ambulatory Visit: Payer: BLUE CROSS/BLUE SHIELD

## 2016-12-06 ENCOUNTER — Other Ambulatory Visit: Payer: BLUE CROSS/BLUE SHIELD

## 2016-12-06 ENCOUNTER — Ambulatory Visit: Admission: RE | Admit: 2016-12-06 | Payer: BLUE CROSS/BLUE SHIELD | Source: Ambulatory Visit

## 2016-12-21 ENCOUNTER — Ambulatory Visit
Admission: RE | Admit: 2016-12-21 | Discharge: 2016-12-21 | Disposition: A | Discharge status: Home / Self Care | Payer: BLUE CROSS/BLUE SHIELD | Source: Ambulatory Visit | Attending: Oncology | Admitting: Oncology

## 2016-12-21 DIAGNOSIS — Z170 Estrogen receptor positive status [ER+]: Principal | ICD-10-CM

## 2016-12-21 DIAGNOSIS — C50411 Malignant neoplasm of upper-outer quadrant of right female breast: Secondary | ICD-10-CM

## 2016-12-21 HISTORY — DX: Personal history of antineoplastic chemotherapy: Z92.21

## 2016-12-21 HISTORY — DX: Personal history of irradiation: Z92.3

## 2017-01-06 ENCOUNTER — Telehealth: Payer: Self-pay | Admitting: *Deleted

## 2017-01-06 ENCOUNTER — Other Ambulatory Visit: Payer: Self-pay | Admitting: *Deleted

## 2017-01-06 MED ORDER — PREDNISONE 10 MG PO TABS: ORAL_TABLET | ORAL | 0 refills | Status: DC

## 2017-01-06 NOTE — Telephone Encounter (Signed)
Per Dr B ok to refill. Rx sent to pharmacy and left message on patient VM that rx was sent to [facility] in [facility]

## 2017-01-06 NOTE — Telephone Encounter (Addendum)
Patient walked into [facility] requesting a refill for her Prednisone d/t her problematic rash on breast from radiation recall. She states Dr Grayland Ormond reassured her that he would maintain her prednisone refills as needed. She takes it with doxycycline. She does not need a refill on the Doxycycline. She needs the prescription as follows;   Prednisone 20mg  for 7 days then  Prednisone 15mg  for 7 days  Prednisone 10mg  for 7 days  Prednisone 5 mg for 7 days

## 2017-01-06 NOTE — Telephone Encounter (Signed)
error 

## 2017-02-23 ENCOUNTER — Other Ambulatory Visit: Payer: Self-pay

## 2017-02-23 MED ORDER — PREDNISONE 10 MG PO TABS: ORAL_TABLET | ORAL | 0 refills | Status: DC

## 2017-02-23 MED ORDER — DOXYCYCLINE HYCLATE 100 MG PO CAPS: 100.0000 mg | ORAL_CAPSULE | Freq: Two times a day (BID) | ORAL | 1 refills | Status: DC

## 2017-03-20 ENCOUNTER — Telehealth: Payer: Self-pay | Admitting: *Deleted

## 2017-03-20 NOTE — Telephone Encounter (Signed)
Patient called stating needs to be seen asap.  Message unclear, however, said something about  radiation and needs medication.

## 2017-03-21 NOTE — Telephone Encounter (Signed)
-----   Message from Lloyd Huger, MD sent at 03/21/2017  1:23 PM EDT ----- Regarding: RE: needs appt 8/10 Contact: [phone] I can see her on 8/17 if that's ok.  ----- Message ----- From: Luretha Murphy, CMA Sent: 03/20/2017   2:00 PM To: Lloyd Huger, MD, Jacquelin Hawking, NP, # Subject: Melton Alar: needs appt 8/10                              ----- Message ----- From: Secundino Ginger Sent: 03/20/2017  10:49 AM To: Luretha Murphy, CMA Subject: needs appt 8/10                                Pt has pt on prednisone, she cant taper off. She keeps having break out. She wants to come 8/10 but schedule is full. Will you call her. You can leave msg

## 2017-03-21 NOTE — Telephone Encounter (Signed)
Called and spoke with patient, She does not want to see Sonia Baller.  She is on 20 mg of prednisone, she can not taper off or she gets worse. Patient has taken 30 mg yesterday and 30 mg today. What do you want her to do? Do you want her to stay on 30mg ?

## 2017-03-21 NOTE — Telephone Encounter (Signed)
Stay on 30 mg until I can see her on 8/17. I can see her in [place] if she wants to come sooner

## 2017-03-22 MED ORDER — PREDNISONE 10 MG PO TABS: ORAL_TABLET | ORAL | 4 refills | Status: DC

## 2017-03-22 NOTE — Addendum Note (Signed)
Addended by: Luretha Murphy on: 03/22/2017 09:11 AM   Modules accepted: Orders

## 2017-03-22 NOTE — Telephone Encounter (Signed)
Notified patient of message below and she voiced understanding.

## 2017-03-29 NOTE — Progress Notes (Signed)
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  INTERVAL HISTORY: Patient returns to clinic today for f/u her breast cancer and complaints of an exacerbation of radiation recall of the right breast. She has a history of right breast lumpectomy in 2011 followed by radiation therapy. Previous mammograms did not show evidence of disease or malignancy. She stopped letrozole in 2017 due to potential skin side effects. She had completed 4.5 years of letrozole. She had a bone density scan in 2015 that demonstrated osteoporosis with a T-score of -2.6. She has previously taken Fosamax but has stopped.   Radiation recall- The erythema of the right breast began approximately 3 years after she had completed radiation therapy for breast malignancy. Dr. Pablo Ledger (radiation oncology) felt the symptoms and imaging were consistent with radiation recall and since then patient has had imaging and referrals including to dermatology without improvement of symptoms. Today she complains that the right breast is persistently erythematous and pustules have developed under her breast. She increased her prednisone dose to 65m and is concerned for worsening exacerbation of symptoms due to planned cruise vacation in September. She has previously taken doxycycline for potential cellulitis of the tissue as well as chronic use of prednisone. She reports that if she drops below prednisone 121mshe has significant worsening of symptoms.  Psoriasis- she has a history of severe psoriasis which she was diagnosed with in the 1990s. She describes symptoms ranging from plaques to pustules with wide spread peeling episodes. She has not had successful  symptom control ever she reports but did feel her symptoms improved during chemo therapy and with the oral prednisone. She has tried several topical treatments, which due to the surface area affected were difficult to apply and cost prohibitive, but has never had systemic therapy. Ultraviolet therapy was recommended at one point but due to cost she was not able to take treatment. She reports a family history of autoimmune diseases.   She has no neurologic complaints. She denies any recent fevers or illnesses. She has good appetite and denies weight loss. She has no chest pain or shortness of breath. She denies any nausea, vomiting, constipation, or diarrhea. She has no urinary complaints. Patient offers no further specific complaints today.  REVIEW OF SYSTEMS:   Review of Systems  Constitutional: Negative.  Negative for fever, malaise/fatigue and weight loss.  Respiratory: Negative.  Negative for cough and shortness of breath.   Cardiovascular: Negative.  Negative for chest pain.  Gastrointestinal: Negative.  Negative for abdominal pain.  Genitourinary: Negative.   Musculoskeletal: Positive for joint pain.  Skin: Positive for rash.       Erythema, pain, sensitivity  Neurological: Negative.  Negative for weakness.  Psychiatric/Behavioral: Negative.     As per HPI. Otherwise, a complete review of systems is negative.  PAST MEDICAL HISTORY: Past Medical History:  Diagnosis Date  . [facility]   right breast metastatic carcinoma   . Depression   . Hypertension   . Personal history of chemotherapy 2011   right breast ca  . Personal history of radiation therapy 2011   right breast ca  . Psoriasis     PAST SURGICAL HISTORY: Past Surgical History:  Procedure Laterality Date  . APPENDECTOMY    .  BREAST LUMPECTOMY Right 2011   lumpectomy. clear margins with reexision of close margin. Rad tx and chemo  . CHOLECYSTECTOMY     partial    FAMILY HISTORY: Reviewed and  unchanged. No reported history of malignancy or chronic disease.     ADVANCED DIRECTIVES:    HEALTH MAINTENANCE: Social History  Substance Use Topics  . Smoking status: Never Smoker  . Smokeless tobacco: Never Used  . Alcohol use Yes     Comment: occasional     Allergies  Allergen Reactions  . Cephalexin Rash  . Tape Itching    Current Outpatient Prescriptions  Medication Sig Dispense Refill  . Acetaminophen 500 MG coapsule Take 2 capsules by mouth every 6 (six) hours as needed.     . bisoprolol-hydrochlorothiazide (ZIAC) 5-6.25 MG per tablet TAKE 1 TABLET BY MOUTH EVERY DAY    . doxycycline (VIBRAMYCIN) 100 MG capsule Take 1 capsule (100 mg total) by mouth 2 (two) times daily. 60 capsule 1  . FLUoxetine (PROZAC) 10 MG capsule 10 mg.     . ibuprofen (ADVIL,MOTRIN) 200 MG tablet Take 800 mg by mouth every 8 (eight) hours as needed for moderate pain. Reported on 10/29/2015    . predniSONE (DELTASONE) 10 MG tablet Take 2 tabs (60m) for 7 days then 1.5 tabs (160m daily for 7 days, 1 tab (1052mdaily for 7 days, 0.5 tab (5 mg) daily for 7 days. 42 tablet 4   No current facility-administered medications for this visit.     OBJECTIVE: Vitals:   03/31/17 1009  BP: 119/77  Pulse: (!) 52  Resp: 20  Temp: (!) 97.2 F (36.2 C)     There is no height or weight on file to calculate BMI.    ECOG FS:1 - Symptomatic but completely ambulatory  General: obese, well-developed, well-nourished, no acute distress. Eyes: anicteric sclera. Breasts: Right breast with wide spread erythema in inframammary fold extending to abdomen and axilla. 1 clear small pustule in inframammary fold.  Lungs: Clear to auscultation bilaterally. Heart: Regular rate and rhythm. No rubs, murmurs, or gallops. Abdomen: Soft, nontender, nondistended. No organomegaly noted, normoactive bowel sounds. Musculoskeletal: No edema, cyanosis, or clubbing. Neuro: Alert, answering all questions appropriately. Cranial nerves  grossly intact. Skin: Active psoriasis on much of her torso, increased erythema of right breast, across gluteal cleft extending posteriorly. Scaly plaques on right hand and arm.  Psych: Normal affect.   LAB RESULTS:  No results found for: NA, K, CL, CO2, GLUCOSE, BUN, CREATININE, CALCIUM, PROT, ALBUMIN, AST, ALT, ALKPHOS, BILITOT, GFRNONAA, GFRAA  No results found for: WBC, NEUTROABS, HGB, HCT, MCV, PLT   STUDIES: No results found.  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  PLAN:    1. 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 Her 5 years would have been finished in August 2017 and she completed 4+ years. Her mammogram in May 2018 showed right breast skin thickening but no suspicion for malignancy in either breast. Repeat in May 2019.  CA-27-29 from 2017 within normal limits. Will plan for her to return in 3 months for further evaluation. May consider additional lab work at that time.   2. Radiation Recall- Ultrasounds, MRI, and Mammograms have shown no concerns for malignancy or concerns for masses. There is consistently some skin thickening and postsurgical changes  which have been reported as benign findings. She does however continue to have symptoms despite chronic steroid use including erythema, blisters, and pain. She adjusted her dose of prednisone up to 66m recently when her symptoms began to worsen. She can continue 30 mg for one week and then recommend reducing dose to 222m   3. Psoriasis- Patient has previously seen dermatology and treatments did not improve her wide spread symptoms. We have discussed extensively the risks of chronic steroid use and inappropriately self-adjusting doses up and/or down. She is concerned of costs but is eager to improve her  symptoms. [facility], biologic therapy, and consultation. She is agreeable to this.    Patient expressed understanding and was in agreement with this plan. She also understands that She can call clinic at any time with any questions, concerns, or complaints.   Breast cancer   Staging form: Breast, AJCC 7th Edition     Clinical stage from 01/26/2015: Stage IIA (T1c, N1, M0) - Signed by TiLloyd HugerMD on 01/26/2015  LaBeverely RisenAlZenia ResidesNP 03/31/17 10:59AM  Patient was seen and evaluated independently and I agree with the assessment and plan as dictated above.  TiLloyd HugerMD 03/31/17 2:13 PM

## 2017-03-31 ENCOUNTER — Inpatient Hospital Stay: Payer: BLUE CROSS/BLUE SHIELD

## 2017-03-31 ENCOUNTER — Inpatient Hospital Stay: Payer: BLUE CROSS/BLUE SHIELD | Attending: Oncology | Admitting: Oncology

## 2017-03-31 VITALS — BP 119/77 | HR 52 | Temp 97.2°F | Resp 20

## 2017-03-31 DIAGNOSIS — Z923 Personal history of irradiation: Secondary | ICD-10-CM | POA: Insufficient documentation

## 2017-03-31 DIAGNOSIS — Z170 Estrogen receptor positive status [ER+]: Secondary | ICD-10-CM | POA: Diagnosis not present

## 2017-03-31 DIAGNOSIS — Z79899 Other long term (current) drug therapy: Secondary | ICD-10-CM | POA: Insufficient documentation

## 2017-03-31 DIAGNOSIS — F329 Major depressive disorder, single episode, unspecified: Secondary | ICD-10-CM | POA: Insufficient documentation

## 2017-03-31 DIAGNOSIS — Z853 Personal history of malignant neoplasm of breast: Secondary | ICD-10-CM | POA: Insufficient documentation

## 2017-03-31 DIAGNOSIS — Z9221 Personal history of antineoplastic chemotherapy: Secondary | ICD-10-CM | POA: Insufficient documentation

## 2017-03-31 DIAGNOSIS — M818 Other osteoporosis without current pathological fracture: Secondary | ICD-10-CM | POA: Insufficient documentation

## 2017-03-31 DIAGNOSIS — C50411 Malignant neoplasm of upper-outer quadrant of right female breast: Secondary | ICD-10-CM

## 2017-03-31 DIAGNOSIS — I10 Essential (primary) hypertension: Secondary | ICD-10-CM | POA: Insufficient documentation

## 2017-03-31 DIAGNOSIS — Z7952 Long term (current) use of systemic steroids: Secondary | ICD-10-CM | POA: Insufficient documentation

## 2017-03-31 DIAGNOSIS — L409 Psoriasis, unspecified: Secondary | ICD-10-CM | POA: Insufficient documentation

## 2017-03-31 NOTE — Progress Notes (Signed)
Patient here today for follow up regarding recent break out due to radiation recall. Patient has been on Prednisone 30 mg daily and states she has break out each time she attempts to taper.

## 2017-04-01 LAB — CANCER ANTIGEN 27.29: CAN 27.29: 7.5 U/mL (ref 0.0–38.6)

## 2017-04-13 ENCOUNTER — Other Ambulatory Visit: Payer: Self-pay | Admitting: *Deleted

## 2017-04-13 MED ORDER — DOXYCYCLINE HYCLATE 100 MG PO CAPS: 100.0000 mg | ORAL_CAPSULE | Freq: Two times a day (BID) | ORAL | 1 refills | Status: DC

## 2017-04-13 NOTE — Telephone Encounter (Signed)
Patient states that she got a call from [facility] Dermatology and that she had told us she will NOT see a dermatologist again, she was to be referred to Immunology and thinks we made a mistake in the referral. She also needs a 3 month supply of Doxycycline because she is going to be traveling a lot.  I spoke with Dr Grayland Ormond who approved the Doxycycline and I spoke with Pietro Cassis, RN who states that she did fax referral to Immunology/ Rheumatology. Perhaps they decided she should see Derm and she will call them to follow up on this and get back with the patient once she has an answer.  Patient informed of this and appreciative for the help

## 2017-04-18 NOTE — Telephone Encounter (Signed)
I spoke with [facility] Immunology/Allergy department, their office sent the referral to [facility] Dermat[facility]. Office states they verified with their medical director and patients condition could not be treated in the Immunology/Allergy department and recommended patient being seen by Dermatology. Patient has already been seen by dermatology in the past and does not wish to purse appointment with [facility]at[facility]. Would you like for me to do any other type of referral at this time?

## 2017-04-18 NOTE — Telephone Encounter (Signed)
Who is their Market researcher?  I can call them directly and ask for advice on how to proceed.

## 2017-04-18 NOTE — Telephone Encounter (Signed)
Dr. Richardean Sale; per office the best form of communication would be email.  [email or web address]

## 2017-05-04 ENCOUNTER — Telehealth: Payer: Self-pay | Admitting: *Deleted

## 2017-05-04 NOTE — Telephone Encounter (Signed)
I called and spoke with patient regarding follow up with [facility] dermatology. Patient states she is not willing to be evaluated again by dermatology, she has been in the past and they offer her no solution. Patient also wanted you to know that she is still taking Prednisone 30 mg daily, needs refills on Prednisone and Doxycycline.

## 2017-05-05 ENCOUNTER — Other Ambulatory Visit: Payer: Self-pay | Admitting: *Deleted

## 2017-05-05 MED ORDER — PREDNISONE 10 MG PO TABS: 30.0000 mg | ORAL_TABLET | Freq: Every day | ORAL | 0 refills | Status: DC

## 2017-05-05 MED ORDER — DOXYCYCLINE HYCLATE 100 MG PO CAPS: 100.0000 mg | ORAL_CAPSULE | Freq: Two times a day (BID) | ORAL | 1 refills | Status: DC

## 2017-05-05 NOTE — Telephone Encounter (Signed)
Refills sent, I will discuss with patient again your recommendations for her to be seen at [facility]. Thanks.

## 2017-05-05 NOTE — Telephone Encounter (Signed)
We can refill her medications. [facility] dermatology has the potential to offer other therapies to get her off the prednisone. I would prefer that she at least attempt to go to this appointment.

## 2017-06-30 ENCOUNTER — Ambulatory Visit: Payer: BLUE CROSS/BLUE SHIELD | Admitting: Oncology

## 2017-06-30 ENCOUNTER — Other Ambulatory Visit: Payer: BLUE CROSS/BLUE SHIELD

## 2017-07-29 ENCOUNTER — Other Ambulatory Visit: Payer: Self-pay | Admitting: Oncology

## 2017-08-16 NOTE — Progress Notes (Deleted)
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  INTERVAL HISTORY: Patient returns to clinic today for routine follow-up. She continues to have a persistently erythematous right breast. She is now on doxycycline 2 times daily as well as 10 mg prednisone daily. Patient states when she decreases her steroid below 10 mg, she has a significant flare on her breast. She has no neurologic complaints. She denies any recent fevers or illnesses. She has good appetite and denies weight loss. She has no chest pain or shortness of breath. She denies any nausea, vomiting, constipation, or diarrhea. She has no urinary complaints. Patient offers no further specific complaints today.  REVIEW OF SYSTEMS:   Review of Systems  Constitutional: Negative.  Negative for fever, malaise/fatigue and weight loss.  Respiratory: Negative.  Negative for cough and shortness of breath.   Cardiovascular: Negative.  Negative for chest pain.  Gastrointestinal: Negative.  Negative for abdominal pain.  Genitourinary: Negative.   Musculoskeletal: Negative.   Skin: Positive for rash.       Erythema  Neurological: Negative.  Negative for weakness.  Psychiatric/Behavioral: Negative.     As per HPI. Otherwise, a complete review of systems is negatve.  PAST MEDICAL HISTORY: Past Medical History:  Diagnosis Date  . Breast cancer [facility]) 2011   right breast metastatic carcinoma   . Depression   . Hypertension   . Personal history of chemotherapy 2011   right breast ca  . Personal history of radiation therapy 2011   right breast ca  . Psoriasis     PAST SURGICAL HISTORY: Past Surgical History:  Procedure Laterality Date  .  APPENDECTOMY    . BREAST LUMPECTOMY Right 2011   lumpectomy. clear margins with reexision of close margin. Rad tx and chemo  . CHOLECYSTECTOMY     partial    FAMILY HISTORY: Reviewed and unchanged. No reported history of malignancy or chronic disease.     ADVANCED DIRECTIVES:    HEALTH MAINTENANCE: Social History   Tobacco Use  . Smoking status: Never Smoker  . Smokeless tobacco: Never Used  Substance Use Topics  . Alcohol use: Yes    Comment: occasional  . Drug use: No     Allergies  Allergen Reactions  . Cephalexin Rash  . Tape Itching    Current Outpatient Medications  Medication Sig Dispense Refill  . Acetaminophen 500 MG coapsule Take 2 capsules by mouth every 6 (six) hours as needed.     . bisoprolol-hydrochlorothiazide (ZIAC) 5-6.25 MG per tablet TAKE 1 TABLET BY MOUTH EVERY DAY    . doxycycline (VIBRAMYCIN) 100 MG capsule Take 1 capsule (100 mg total) by mouth 2 (two) times daily. 180 capsule 1  . FLUoxetine (PROZAC) 10 MG capsule 10 mg.     . ibuprofen (ADVIL,MOTRIN) 200 MG tablet Take 800 mg by mouth every 8 (eight) hours as needed for moderate pain. Reported on 10/29/2015    . predniSONE (DELTASONE) 10 MG tablet Take 2 tabs (63m) for 7 days then 1.5 tabs (15m daily for 7 days, 1 tab (1072mdaily for 7 days, 0.5 tab (5 mg) daily for 7 days. 42 tablet 4  . predniSONE (DELTASONE) 10 MG tablet TAKE 3 TABLETS(30 MG) BY MOUTH DAILY WITH BREAKFAST 270 tablet  0   No current facility-administered medications for this visit.     OBJECTIVE: There were no vitals filed for this visit.   There is no height or weight on file to calculate BMI.    ECOG FS:0 - Asymptomatic  General: Well-developed, well-nourished, no acute distress. Eyes: anicteric sclera. Breasts: Right breast with mild erythema. Lungs: Clear to auscultation bilaterally. Heart: Regular rate and rhythm. No rubs, murmurs, or gallops. Abdomen: Soft, nontender, nondistended. No organomegaly noted,  normoactive bowel sounds. Musculoskeletal: No edema, cyanosis, or clubbing. Neuro: Alert, answering all questions appropriately. Cranial nerves grossly intact. Skin: Active psoriasis on much of her torso, increased erythema of right breast. Psych: Normal affect.   LAB RESULTS:  No results found for: NA, K, CL, CO2, GLUCOSE, BUN, CREATININE, CALCIUM, PROT, ALBUMIN, AST, ALT, ALKPHOS, BILITOT, GFRNONAA, GFRAA  No results found for: WBC, NEUTROABS, HGB, HCT, MCV, PLT   STUDIES: No results found.  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  PLAN:    1. 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 Her 5 years would have been finished in August 2017 and at this time she has completed 4+ years. Her mammogram in April 2017 showed right breast skin thickening but no suspicion for malignancy in either breast. Repeat in April 2018.  CA-27-29 continues to be within normal limits. Return to clinic in 6 months for further evaluation. 2. Osteoporosis: Bone mineral density on November 28, 2013 was reported as T score of -2.6, continue Fosamax as prescribed. Patient has been instructed to continue calcium and vitamin D twice daily.  Per patient request no more bone density scans due to insurance. 3.  Anxiety: Continue Prozac 20 mg as well as Xanax 0.25 mg as needed. 4.  Breast erythema: Discontinue letrozole as above. Patient currently is taking 100 mg doxycycline twice a day. She has been instructed to attempt to taper this to once per day. She takes 10 mg prednisone daily. Patient states that she drops her steroids below this, she has a significant flare in her breast and is hesitant to decrease any further. She expressed understanding of the risks of chronic steroid use.  5.  Lymphedema: Right arm. Patient refused referral to  lymphedema clinic at this time.   Patient expressed understanding and was in agreement with this plan. She also understands that She can call clinic at any time with any questions, concerns, or complaints.   Breast cancer   Staging form: Breast, AJCC 7th Edition     Clinical stage from 01/26/2015: Stage IIA (T1c, N1, M0) - Signed by Lloyd Huger, MD on 01/26/2015   Lloyd Huger, MD   08/16/2017 11:02 AM

## 2017-08-18 ENCOUNTER — Ambulatory Visit: Payer: BLUE CROSS/BLUE SHIELD | Admitting: Oncology

## 2017-08-18 ENCOUNTER — Other Ambulatory Visit: Payer: BLUE CROSS/BLUE SHIELD

## 2017-08-23 NOTE — Progress Notes (Deleted)
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  INTERVAL HISTORY: Patient returns to clinic today for routine follow-up. She continues to have a persistently erythematous right breast. She is now on doxycycline 2 times daily as well as 10 mg prednisone daily. Patient states when she decreases her steroid below 10 mg, she has a significant flare on her breast. She has no neurologic complaints. She denies any recent fevers or illnesses. She has good appetite and denies weight loss. She has no chest pain or shortness of breath. She denies any nausea, vomiting, constipation, or diarrhea. She has no urinary complaints. Patient offers no further specific complaints today.  REVIEW OF SYSTEMS:   Review of Systems  Constitutional: Negative.  Negative for fever, malaise/fatigue and weight loss.  Respiratory: Negative.  Negative for cough and shortness of breath.   Cardiovascular: Negative.  Negative for chest pain.  Gastrointestinal: Negative.  Negative for abdominal pain.  Genitourinary: Negative.   Musculoskeletal: Negative.   Skin: Positive for rash.       Erythema  Neurological: Negative.  Negative for weakness.  Psychiatric/Behavioral: Negative.     As per HPI. Otherwise, a complete review of systems is negatve.  PAST MEDICAL HISTORY: Past Medical History:  Diagnosis Date  . Breast cancer [facility]) 2011   right breast metastatic carcinoma   . Depression   . Hypertension   . Personal history of chemotherapy 2011   right breast ca  . Personal history of radiation therapy 2011   right breast ca  . Psoriasis     PAST SURGICAL HISTORY: Past Surgical History:  Procedure Laterality Date  .  APPENDECTOMY    . BREAST LUMPECTOMY Right 2011   lumpectomy. clear margins with reexision of close margin. Rad tx and chemo  . CHOLECYSTECTOMY     partial    FAMILY HISTORY: Reviewed and unchanged. No reported history of malignancy or chronic disease.     ADVANCED DIRECTIVES:    HEALTH MAINTENANCE: Social History   Tobacco Use  . Smoking status: Never Smoker  . Smokeless tobacco: Never Used  Substance Use Topics  . Alcohol use: Yes    Comment: occasional  . Drug use: No     Allergies  Allergen Reactions  . Cephalexin Rash  . Tape Itching    Current Outpatient Medications  Medication Sig Dispense Refill  . Acetaminophen 500 MG coapsule Take 2 capsules by mouth every 6 (six) hours as needed.     . bisoprolol-hydrochlorothiazide (ZIAC) 5-6.25 MG per tablet TAKE 1 TABLET BY MOUTH EVERY DAY    . doxycycline (VIBRAMYCIN) 100 MG capsule Take 1 capsule (100 mg total) by mouth 2 (two) times daily. 180 capsule 1  . FLUoxetine (PROZAC) 10 MG capsule 10 mg.     . ibuprofen (ADVIL,MOTRIN) 200 MG tablet Take 800 mg by mouth every 8 (eight) hours as needed for moderate pain. Reported on 10/29/2015    . predniSONE (DELTASONE) 10 MG tablet Take 2 tabs (51m) for 7 days then 1.5 tabs (16m daily for 7 days, 1 tab (1030mdaily for 7 days, 0.5 tab (5 mg) daily for 7 days. 42 tablet 4  . predniSONE (DELTASONE) 10 MG tablet TAKE 3 TABLETS(30 MG) BY MOUTH DAILY WITH BREAKFAST 270 tablet  0   No current facility-administered medications for this visit.     OBJECTIVE: There were no vitals filed for this visit.   There is no height or weight on file to calculate BMI.    ECOG FS:0 - Asymptomatic  General: Well-developed, well-nourished, no acute distress. Eyes: anicteric sclera. Breasts: Right breast with mild erythema. Lungs: Clear to auscultation bilaterally. Heart: Regular rate and rhythm. No rubs, murmurs, or gallops. Abdomen: Soft, nontender, nondistended. No organomegaly noted,  normoactive bowel sounds. Musculoskeletal: No edema, cyanosis, or clubbing. Neuro: Alert, answering all questions appropriately. Cranial nerves grossly intact. Skin: Active psoriasis on much of her torso, increased erythema of right breast. Psych: Normal affect.   LAB RESULTS:  No results found for: NA, K, CL, CO2, GLUCOSE, BUN, CREATININE, CALCIUM, PROT, ALBUMIN, AST, ALT, ALKPHOS, BILITOT, GFRNONAA, GFRAA  No results found for: WBC, NEUTROABS, HGB, HCT, MCV, PLT   STUDIES: No results found.  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  PLAN:    1. 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 Her 5 years would have been finished in August 2017 and at this time she has completed 4+ years. Her mammogram in April 2017 showed right breast skin thickening but no suspicion for malignancy in either breast. Repeat in April 2018.  CA-27-29 continues to be within normal limits. Return to clinic in 6 months for further evaluation. 2. Osteoporosis: Bone mineral density on November 28, 2013 was reported as T score of -2.6, continue Fosamax as prescribed. Patient has been instructed to continue calcium and vitamin D twice daily.  Per patient request no more bone density scans due to insurance. 3.  Anxiety: Continue Prozac 20 mg as well as Xanax 0.25 mg as needed. 4.  Breast erythema: Discontinue letrozole as above. Patient currently is taking 100 mg doxycycline twice a day. She has been instructed to attempt to taper this to once per day. She takes 10 mg prednisone daily. Patient states that she drops her steroids below this, she has a significant flare in her breast and is hesitant to decrease any further. She expressed understanding of the risks of chronic steroid use.  5.  Lymphedema: Right arm. Patient refused referral to  lymphedema clinic at this time.   Patient expressed understanding and was in agreement with this plan. She also understands that She can call clinic at any time with any questions, concerns, or complaints.   Breast cancer   Staging form: Breast, AJCC 7th Edition     Clinical stage from 01/26/2015: Stage IIA (T1c, N1, M0) - Signed by Lloyd Huger, MD on 01/26/2015   Lloyd Huger, MD   08/23/2017 11:15 PM

## 2017-08-25 ENCOUNTER — Ambulatory Visit: Payer: BLUE CROSS/BLUE SHIELD | Admitting: Oncology

## 2017-08-25 ENCOUNTER — Other Ambulatory Visit: Payer: BLUE CROSS/BLUE SHIELD

## 2017-09-01 ENCOUNTER — Inpatient Hospital Stay: Payer: BLUE CROSS/BLUE SHIELD

## 2017-09-01 ENCOUNTER — Encounter: Payer: Self-pay | Admitting: Oncology

## 2017-09-01 ENCOUNTER — Inpatient Hospital Stay: Payer: BLUE CROSS/BLUE SHIELD | Attending: Oncology | Admitting: Oncology

## 2017-09-01 ENCOUNTER — Other Ambulatory Visit: Payer: Self-pay

## 2017-09-01 VITALS — BP 143/83 | HR 53 | Temp 97.5°F | Resp 20 | Wt 314.2 lb

## 2017-09-01 DIAGNOSIS — Z170 Estrogen receptor positive status [ER+]: Principal | ICD-10-CM

## 2017-09-01 DIAGNOSIS — F419 Anxiety disorder, unspecified: Secondary | ICD-10-CM

## 2017-09-01 DIAGNOSIS — C50411 Malignant neoplasm of upper-outer quadrant of right female breast: Secondary | ICD-10-CM

## 2017-09-01 DIAGNOSIS — I890 Lymphedema, not elsewhere classified: Secondary | ICD-10-CM | POA: Insufficient documentation

## 2017-09-01 DIAGNOSIS — Z780 Asymptomatic menopausal state: Secondary | ICD-10-CM

## 2017-09-01 DIAGNOSIS — M810 Age-related osteoporosis without current pathological fracture: Secondary | ICD-10-CM | POA: Insufficient documentation

## 2017-09-01 DIAGNOSIS — L539 Erythematous condition, unspecified: Secondary | ICD-10-CM | POA: Insufficient documentation

## 2017-09-01 DIAGNOSIS — Z853 Personal history of malignant neoplasm of breast: Secondary | ICD-10-CM | POA: Insufficient documentation

## 2017-09-01 DIAGNOSIS — C50919 Malignant neoplasm of unspecified site of unspecified female breast: Secondary | ICD-10-CM

## 2017-09-01 NOTE — Progress Notes (Signed)
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  INTERVAL HISTORY: Patient returns to clinic today for routine 39-monthfollow-up. She is currently taking 30 mg of prednisone and states she has not had a flare of the skin on her right breast for 3-4 months.  She is no longer taking doxycycline.  She currently feels well and is asymptomatic.  She has no neurologic complaints. She denies any recent fevers or illnesses. She has a good appetite and denies weight loss. She has no chest pain or shortness of breath. She denies any nausea, vomiting, constipation, or diarrhea. She has no urinary complaints. Patient offers no further specific complaints today.  REVIEW OF SYSTEMS:   Review of Systems  Constitutional: Negative.  Negative for fever, malaise/fatigue and weight loss.  Respiratory: Negative.  Negative for cough and shortness of breath.   Cardiovascular: Negative.  Negative for chest pain and leg swelling.  Gastrointestinal: Negative.  Negative for abdominal pain.  Genitourinary: Negative.  Negative for dysuria.  Musculoskeletal: Negative.   Skin: Positive for rash.       Erythema  Neurological: Negative.  Negative for sensory change, focal weakness and weakness.  Psychiatric/Behavioral: Negative.  The patient is not nervous/anxious.     As per HPI. Otherwise, a complete review of systems is negatve.  PAST MEDICAL HISTORY: Past Medical History:  Diagnosis Date  . Breast cancer ([facility] 2011   right breast metastatic carcinoma   . Depression   . Hypertension   . Personal history of chemotherapy 2011   right breast ca  . Personal history of radiation therapy 2011   right breast ca  . Psoriasis       PAST SURGICAL HISTORY: Past Surgical History:  Procedure Laterality Date  . APPENDECTOMY    . BREAST LUMPECTOMY Right 2011   lumpectomy. clear margins with reexision of close margin. Rad tx and chemo  . CHOLECYSTECTOMY     partial    FAMILY HISTORY: Reviewed and unchanged. No reported history of malignancy or chronic disease.     ADVANCED DIRECTIVES:    HEALTH MAINTENANCE: Social History   Tobacco Use  . Smoking status: Never Smoker  . Smokeless tobacco: Never Used  Substance Use Topics  . Alcohol use: Yes    Comment: occasional  . Drug use: No     Allergies  Allergen Reactions  . Cephalexin Rash  . Tape Itching    Current Outpatient Medications  Medication Sig Dispense Refill  . Acetaminophen 500 MG coapsule Take 2 capsules by mouth every 6 (six) hours as needed.     . bisoprolol-hydrochlorothiazide (ZIAC) 5-6.25 MG per tablet TAKE 1 TABLET BY MOUTH EVERY DAY    . doxycycline (VIBRAMYCIN) 100 MG capsule Take 1 capsule (100 mg total) by mouth 2 (two) times daily. 180 capsule 1  . FLUoxetine (PROZAC) 10 MG capsule 10 mg.     . ibuprofen (ADVIL,MOTRIN) 200 MG tablet Take 800 mg by mouth every 8 (eight) hours as needed for moderate pain. Reported on 10/29/2015    . predniSONE (DELTASONE) 10 MG tablet Take 2 tabs (281m for 7 days then 1.5 tabs (1598mdaily for 7 days, 1 tab (28m73maily for 7 days, 0.5 tab (5 mg) daily for 7 days. [place]  tablet 4  . predniSONE (DELTASONE) 10 MG tablet TAKE 3 TABLETS(30 MG) BY MOUTH DAILY WITH BREAKFAST 270 tablet 0   No current facility-administered medications for this visit.     OBJECTIVE: Vitals:   09/01/17 1102  BP: (!) 143/83  Pulse: (!) 53  Resp: 20  Temp: (!) 97.5 F (36.4 C)     Body mass index is 50.71 kg/m.    ECOG FS:0 - Asymptomatic  General: Well-developed, well-nourished, no acute distress. Eyes: anicteric sclera. Breasts: Right breast with mild erythema. Lungs: Clear to auscultation bilaterally. Heart:  Regular rate and rhythm. No rubs, murmurs, or gallops. Abdomen: Soft, nontender, nondistended. No organomegaly noted, normoactive bowel sounds. Musculoskeletal: No edema, cyanosis, or clubbing. Neuro: Alert, answering all questions appropriately. Cranial nerves grossly intact. Skin: Active psoriasis on much of her torso, increased erythema of right breast. Psych: Normal affect.   LAB RESULTS:  No results found for: NA, K, CL, CO2, GLUCOSE, BUN, CREATININE, CALCIUM, PROT, ALBUMIN, AST, ALT, ALKPHOS, BILITOT, GFRNONAA, GFRAA  No results found for: WBC, NEUTROABS, HGB, HCT, MCV, PLT   STUDIES: No results found.  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  PLAN:    1. 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 Her 5 years would have been finished in August 2017 and at this time she has completed 4+ years.  Patient's most recent mammogram on Dec 21, 2016 was reported as BI-RADS 2.  Repeat in May 2019.  CA-27-29 continues to be within normal limits. Return to clinic in 6 months for further evaluation. 2. Osteoporosis: Bone mineral density on November 28, 2013 was reported as T score of -2.6.  Patient no longer takes Fosamax. Patient has been instructed to continue calcium and vitamin D twice daily.  Previously she refused additional bone mineral densities, but has agreed to repeat in 6 months.   3.  Anxiety: Continue Prozac 20 mg as well as Xanax 0.25 mg as needed. 4.  Breast erythema: Discontinue letrozole as above. Patient currently is taking 30 mg prednisone daily.  She has been instructed to slowly taper prednisone down to 25 mg for 3 months and then 20 mg for 3 months.  Patient was given a referral to [facility] dermatology, but declined to go to this visit. She expressed understanding of the risks of chronic steroid use, but  wishes to continue treatment.  5.  Lymphedema: Right arm. Patient previously refused referral to lymphedema clinic.   Patient expressed understanding and was in agreement with this plan. She also understands that She can call clinic at any time with any questions, concerns, or complaints.   Breast cancer   Staging form: Breast, AJCC 7th Edition     Clinical stage from 01/26/2015: Stage IIA (T1c, N1, M0) - Signed by Timothy J Finnegan, MD on 01/26/2015   Timothy J Finnegan, MD   09/01/2017 11:09 AM            

## 2017-09-01 NOTE — Progress Notes (Signed)
Patient denies any concerns today.  

## 2017-09-02 LAB — CANCER ANTIGEN 27.29: CAN 27.29: 12.4 U/mL (ref 0.0–38.6)

## 2017-10-17 ENCOUNTER — Ambulatory Visit
Admission: EM | Admit: 2017-10-17 | Discharge: 2017-10-17 | Disposition: A | Discharge status: Home / Self Care | Payer: BLUE CROSS/BLUE SHIELD | Attending: Family Medicine | Admitting: Family Medicine

## 2017-10-17 ENCOUNTER — Other Ambulatory Visit: Payer: Self-pay

## 2017-10-17 DIAGNOSIS — L03113 Cellulitis of right upper limb: Secondary | ICD-10-CM

## 2017-10-17 MED ORDER — SULFAMETHOXAZOLE-TRIMETHOPRIM 800-160 MG PO TABS: 1.0000 | ORAL_TABLET | Freq: Two times a day (BID) | ORAL | 0 refills | Status: DC

## 2017-10-17 NOTE — ED Provider Notes (Signed)
[facility]    CSN: [id number] Arrival date & time: 10/17/17  0932     History   Chief Complaint Chief Complaint  Patient presents with  . Insect Bite    HPI Tiffany Wilson is a 62 y.o. female.   62 yo female with a c/o right forearm redness, swelling and pain since yesterday. States thinks she may have been bitten by some insect. Denies any fevers, chills, or drainage.    The history is provided by the patient.    Past Medical History:  Diagnosis Date  . Breast cancer [facility]) 2011   right breast metastatic carcinoma   . Depression   . Hypertension   . Personal history of chemotherapy 2011   right breast ca  . Personal history of radiation therapy 2011   right breast ca  . Psoriasis     Patient Active Problem List   Diagnosis Date Noted  . Breast cancer of upper-outer quadrant of right female breast ([place]) 01/26/2015  . Clinical depression 03/06/2014  . BP (high blood pressure) 03/06/2014  . Adiposity 03/06/2014  . Psoriasis 03/06/2014    Past Surgical History:  Procedure Laterality Date  . APPENDECTOMY    . BREAST LUMPECTOMY Right 2011   lumpectomy. clear margins with reexision of close margin. Rad tx and chemo  . CHOLECYSTECTOMY     partial    OB History    No data available       Home Medications    Prior to Admission medications   Medication Sig Start Date End Date Taking? Authorizing Provider  Acetaminophen 500 MG coapsule Take 2 capsules by mouth every 6 (six) hours as needed.    Yes [provider]  bisoprolol-hydrochlorothiazide (ZIAC) 5-6.25 MG per tablet TAKE 1 TABLET BY MOUTH EVERY DAY 01/07/15  Yes [provider]  FLUoxetine (PROZAC) 10 MG capsule 10 mg.  12/31/14  Yes [provider]  predniSONE (DELTASONE) 10 MG tablet TAKE 3 TABLETS(30 MG) BY MOUTH DAILY WITH BREAKFAST 07/29/17  Yes Lloyd Huger, MD  doxycycline (VIBRAMYCIN) 100 MG capsule Take 1 capsule (100 mg total) by mouth 2 (two)  times daily. 05/05/17   Lloyd Huger, MD  ibuprofen (ADVIL,MOTRIN) 200 MG tablet Take 800 mg by mouth every 8 (eight) hours as needed for moderate pain. Reported on 10/29/2015    [provider]  predniSONE (DELTASONE) 10 MG tablet Take 2 tabs (20mg ) for 7 days then 1.5 tabs (15mg ) daily for 7 days, 1 tab (10mg ) daily for 7 days, 0.5 tab (5 mg) daily for 7 days. 03/22/17   Lloyd Huger, MD  sulfamethoxazole-trimethoprim (BACTRIM DS,SEPTRA DS) 800-160 MG tablet Take 1 tablet by mouth 2 (two) times daily. 10/17/17   Norval Gable, MD    Family History Family History  Problem Relation Age of Onset  . Breast cancer Mother 59  . Pancreatic cancer Father     Social History Social History   Tobacco Use  . Smoking status: Never Smoker  . Smokeless tobacco: Never Used  Substance Use Topics  . Alcohol use: Yes    Comment: occasional  . Drug use: No     Allergies   Cephalexin and Tape   Review of Systems Review of Systems   Physical Exam Triage Vital Signs ED Triage Vitals  Enc Vitals Group     BP 10/17/17 1023 (!) 144/66     Pulse Rate 10/17/17 1023 (!) 56     Resp 10/17/17 1023 18  Temp 10/17/17 1023 98.1 F (36.7 C)     Temp Source 10/17/17 1023 Oral     SpO2 10/17/17 1023 99 %     Weight 10/17/17 1021 300 lb (136.1 kg)     Height 10/17/17 1021 5\' 6"  (1.676 m)     Head Circumference --      Peak Flow --      Pain Score 10/17/17 1021 8     Pain Loc --      Pain Edu? --      Excl. in [place]? --    No data found.  Updated Vital Signs BP (!) 144/66 (BP Location: Left Arm)   Pulse (!) 56   Temp 98.1 F (36.7 C) (Oral)   Resp 18   Ht 5\' 6"  (1.676 m)   Wt 300 lb (136.1 kg)   SpO2 99%   BMI 48.42 kg/m   Visual Acuity Right Eye Distance:   Left Eye Distance:   Bilateral Distance:    Right Eye Near:   Left Eye Near:    Bilateral Near:     Physical Exam  Constitutional: She appears well-developed and well-nourished. No distress.  Skin: She  is not diaphoretic. There is erythema.  Right forearm skin blanchable erythema, warmth, swelling and tenderness to palpation  Nursing note and vitals reviewed.    UC Treatments / Results  Labs (all labs ordered are listed, but only abnormal results are displayed) Labs Reviewed - No data to display  EKG  EKG Interpretation None       Radiology No results found.  Procedures Procedures (including critical care time)  Medications Ordered in UC Medications - No data to display   Initial Impression / Assessment and Plan / UC Course  I have reviewed the triage vital signs and the nursing notes.  Pertinent labs & imaging results that were available during my care of the patient were reviewed by me and considered in my medical decision making (see chart for details).       Final Clinical Impressions(s) / UC Diagnoses   Final diagnoses:  Cellulitis of arm, right    ED Discharge Orders        Ordered    sulfamethoxazole-trimethoprim (BACTRIM DS,SEPTRA DS) 800-160 MG tablet  2 times daily     10/17/17 1105     1. diagnosis reviewed with patient 2. rx as per orders above; reviewed possible side effects, interactions, risks and benefits  3. Recommend supportive treatment with elevation, warm compresses, otc analgesics prn 4. Follow-up prn if symptoms worsen or don't improve  Controlled Substance Prescriptions [place] Controlled Substance Registry consulted? Not Applicable   Norval Gable, MD 10/17/17 [phone]

## 2017-10-17 NOTE — ED Triage Notes (Signed)
Patient states that around 11pm Sunday night she had some bite her. Patient states that it bit her right foreamr. Patient has significant swelling and redness. Patient reports that area is very painful. 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 Patient reports that after bite or sting she started running fever and had nausea. Patient states that fever has come down since.

## 2017-11-08 ENCOUNTER — Other Ambulatory Visit: Payer: Self-pay | Admitting: Oncology

## 2017-12-11 ENCOUNTER — Ambulatory Visit
Admission: EM | Admit: 2017-12-11 | Discharge: 2017-12-11 | Disposition: A | Discharge status: Home / Self Care | Payer: BLUE CROSS/BLUE SHIELD | Attending: Emergency Medicine | Admitting: Emergency Medicine

## 2017-12-11 ENCOUNTER — Encounter: Payer: Self-pay | Admitting: Emergency Medicine

## 2017-12-11 ENCOUNTER — Other Ambulatory Visit: Payer: Self-pay

## 2017-12-11 DIAGNOSIS — J209 Acute bronchitis, unspecified: Secondary | ICD-10-CM

## 2017-12-11 MED ORDER — AZITHROMYCIN 250 MG PO TABS: 250.0000 mg | ORAL_TABLET | Freq: Every day | ORAL | 0 refills | Status: DC

## 2017-12-11 NOTE — Discharge Instructions (Addendum)
If symptoms worsen in any way, consider going to the emergency room. If symptoms persist past 1 week, follow up with your primary care provider or return as needed.

## 2017-12-11 NOTE — ED Triage Notes (Signed)
Patient in today c/o 3 week history of cough, congestion and wheezing. Patient denies fever, body aches or chills. Patient has tried OTC Claritin, Zyrtec, Dayquil/Nyquil without relief.

## 2017-12-11 NOTE — ED Provider Notes (Signed)
[facility]    CSN: [id number] Arrival date & time: 12/11/17  0932     History   Chief Complaint Chief Complaint  Patient presents with  . Cough    HPI Tiffany Wilson is a 62 y.o. female.   Tiffany Wilson is a 62 y.o. female who presents with 3 week history of cough. Denies n/v, loss of appetite, fever, no loss of appetite. 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   The history is provided by the patient.  Cough  Cough characteristics:  Non-productive, dry and hacking Sputum characteristics:  Green Severity:  Moderate Onset quality:  Gradual Duration:  3 weeks Timing:  Constant Progression:  Unchanged Chronicity:  New Smoker: no   Context: upper respiratory infection and weather changes   Context: not smoke exposure   Relieved by:  Cough suppressants and beta-agonist inhaler Worsened by:  Lying down and deep breathing Ineffective treatments:  Decongestant Associated symptoms: rhinorrhea and wheezing   Associated symptoms: no chest pain, no chills, no fever, no headaches, no myalgias, no shortness of breath, no sinus congestion and no sore throat     Past Medical History:  Diagnosis Date  . Breast cancer [facility]) 2011   right breast metastatic carcinoma   . Depression   . Hypertension   . Personal history of chemotherapy 2011   right breast ca  . Personal history of radiation therapy 2011   right breast ca  . Psoriasis     Patient Active Problem List   Diagnosis Date Noted  . Breast cancer of upper-outer quadrant of right female breast ([place]) 01/26/2015  . Clinical depression 03/06/2014  . BP (high blood pressure) 03/06/2014  . Adiposity 03/06/2014  . Psoriasis 03/06/2014    Past Surgical History:  Procedure Laterality Date  . APPENDECTOMY    . BREAST LUMPECTOMY Right 2011   lumpectomy. clear margins with reexision of close margin. Rad tx and chemo  . CHOLECYSTECTOMY     partial     OB History   None      Home Medications    Prior to Admission medications   Medication Sig Start Date End Date Taking? Authorizing Provider  Acetaminophen 500 MG coapsule Take 2 capsules by mouth every 6 (six) hours as needed.    Yes [provider]  bisoprolol-hydrochlorothiazide (ZIAC) 5-6.25 MG per tablet TAKE 1 TABLET BY MOUTH EVERY DAY 01/07/15  Yes [provider]  FLUoxetine (PROZAC) 10 MG capsule 10 mg.  12/31/14  Yes [provider]  predniSONE (DELTASONE) 10 MG tablet TAKE 3 TABLETS(30 MG) BY MOUTH DAILY WITH BREAKFAST 11/08/17  Yes Lloyd Huger, MD  azithromycin (ZITHROMAX) 250 MG tablet Take 1 tablet (250 mg total) by mouth daily. Take first 2 tablets together, then 1 every day until finished. 12/11/17   Barnet Glasgow, NP    Family History Family History  Problem Relation Age of Onset  . Breast cancer Mother 30  . Pancreatic cancer Father     Social History Social History   Tobacco Use  . Smoking status: Never Smoker  . Smokeless tobacco: Never Used  Substance Use Topics  . Alcohol use: Yes    Comment: occasional  . Drug use: No     Allergies   Cephalexin and Tape   Review of Systems Review of Systems  Constitutional: Negative for chills and fever.  HENT: Positive for congestion and rhinorrhea. Negative for sinus pressure, sneezing and sore throat.  Respiratory: Positive for cough and wheezing. Negative for shortness of breath.   Cardiovascular: Negative for chest pain, palpitations and leg swelling.  Gastrointestinal: Negative for abdominal pain, diarrhea, nausea and vomiting.  Musculoskeletal: Negative for myalgias.  Neurological: Negative for dizziness and headaches.     Physical Exam Triage Vital Signs ED Triage Vitals  Enc Vitals Group     BP 12/11/17 0950 (!) 149/61     Pulse Rate 12/11/17 0950 63     Resp 12/11/17 0950 16     Temp 12/11/17 0950 98.4 F (36.9 C)     Temp Source 12/11/17 0950 Oral      SpO2 12/11/17 0950 97 %     Weight 12/11/17 0949 300 lb (136.1 kg)     Height 12/11/17 0949 5\' 6"  (1.676 m)     Head Circumference --      Peak Flow --      Pain Score 12/11/17 0949 0     Pain Loc --      Pain Edu? --      Excl. in [place]? --    No data found.  Updated Vital Signs BP (!) 149/61 (BP Location: Left Arm) Comment (BP Location): lg cuff  Pulse 63   Temp 98.4 F (36.9 C) (Oral)   Resp 16   Ht 5\' 6"  (1.676 m)   Wt 300 lb (136.1 kg)   SpO2 97%   BMI 48.42 kg/m   Visual Acuity Right Eye Distance:   Left Eye Distance:   Bilateral Distance:    Right Eye Near:   Left Eye Near:    Bilateral Near:     Physical Exam  Constitutional: She appears well-developed and well-nourished. No distress.  HENT:  Head: Normocephalic and atraumatic.  Right Ear: Tympanic membrane and external ear normal.  Left Ear: Tympanic membrane and external ear normal.  Nose: Nose normal.  Mouth/Throat: Uvula is midline and oropharynx is clear and moist.  Eyes: Conjunctivae are normal.  Cardiovascular: Normal rate and regular rhythm.  Pulmonary/Chest: Effort normal and breath sounds normal.  Abdominal: Soft. There is no tenderness.  Lymphadenopathy:    She has no cervical adenopathy.  Neurological: She is alert.  Skin: Skin is warm and dry. Capillary refill takes less than 2 seconds. She is not diaphoretic.  Nursing note and vitals reviewed.    UC Treatments / Results  Labs (all labs ordered are listed, but only abnormal results are displayed) Labs Reviewed - No data to display  EKG None Radiology No results found.  Procedures Procedures (including critical care time)  Medications Ordered in UC Medications - No data to display   Initial Impression / Assessment and Plan / UC Course  I have reviewed the triage vital signs and the nursing notes.  Pertinent labs & imaging results that were available during my care of the patient were reviewed by me and considered in my  medical decision making (see chart for details).     Signs and symptoms consistent with bronchitis, patient has past history of this and states symptoms are similar. No cardiac symptoms such as peripheral edema, chest pain, nocturnal dyspnea. Currently states she has cough medicine, and is on prednisone, declines albuterol inhaler. We'll treat with azithromycin, follow-up with primary care as needed.  Final Clinical Impressions(s) / UC Diagnoses   Final diagnoses:  Acute bronchitis, unspecified organism    ED Discharge Orders        Ordered    azithromycin (ZITHROMAX) 250 MG tablet  Daily     12/11/17 1022       Controlled Substance Prescriptions [place] Controlled Substance Registry consulted? Not Applicable   Barnet Glasgow, NP 12/11/17 1032

## 2018-01-13 ENCOUNTER — Ambulatory Visit
Admission: EM | Admit: 2018-01-13 | Discharge: 2018-01-13 | Disposition: A | Discharge status: Home / Self Care | Payer: BLUE CROSS/BLUE SHIELD

## 2018-01-13 ENCOUNTER — Encounter: Payer: Self-pay | Admitting: Gynecology

## 2018-01-13 ENCOUNTER — Other Ambulatory Visit: Payer: Self-pay

## 2018-01-13 DIAGNOSIS — L03113 Cellulitis of right upper limb: Secondary | ICD-10-CM | POA: Diagnosis not present

## 2018-01-13 DIAGNOSIS — I998 Other disorder of circulatory system: Secondary | ICD-10-CM

## 2018-01-13 MED ORDER — SULFAMETHOXAZOLE-TRIMETHOPRIM 800-160 MG PO TABS: 1.0000 | ORAL_TABLET | Freq: Two times a day (BID) | ORAL | 0 refills | Status: DC

## 2018-01-13 MED ORDER — MUPIROCIN 2 % EX OINT: 1.0000 "application " | TOPICAL_OINTMENT | Freq: Three times a day (TID) | CUTANEOUS | 0 refills | Status: DC

## 2018-01-13 NOTE — Discharge Instructions (Signed)
Apply warm compresses 3 times daily for 10 minutes at a time.  Dry thoroughly and apply muprocin.

## 2018-01-13 NOTE — ED Triage Notes (Signed)
Patient c/o recurrent cellulitis on right hand.

## 2018-01-13 NOTE — ED Provider Notes (Signed)
[facility]    CSN: [id number] Arrival date & time: 01/13/18  8756     History   Chief Complaint Chief Complaint  Patient presents with  . Recurrent Skin Infections    HPI Tiffany Wilson is a 62 y.o. female.   HPI  62 year old female presents with "cellulitis" of the right forearm.  She states that she woke up this morning after sleeping on her arm which was numb, noticed a large area of erythema that was blanchable.  She does not remember having any bug bite to the area.  Is that the numbness has since subsided.  Now she has a large area of redness over the distal right forearm not involving the hand.  She also has areas of psoriasis which she normally has.  States it is exactly like she had had a visit here 10/17/2017. At that Time she was diagnosed with cellulitis from a possible bug bite.  At that time she was placed on Septra DS which she improved  the symptoms quickly.  Is on chronic prednisone therapy.  What is unusual of this case is the fact that she was laying on the arm for an undetermined amount of time and woke up with the numbness from the pressure.  No puncture wounds in the area of erythema; it is blanchable.  There does not appear to be any deep ischemia seen.        Past Medical History:  Diagnosis Date  . Breast cancer [facility]) 2011   right breast metastatic carcinoma   . Depression   . Hypertension   . Personal history of chemotherapy 2011   right breast ca  . Personal history of radiation therapy 2011   right breast ca  . Psoriasis     Patient Active Problem List   Diagnosis Date Noted  . Breast cancer of upper-outer quadrant of right female breast ([place]) 01/26/2015  . Clinical depression 03/06/2014  . BP (high blood pressure) 03/06/2014  . Adiposity 03/06/2014  . Psoriasis 03/06/2014    Past Surgical History:  Procedure Laterality Date  . APPENDECTOMY    . BREAST LUMPECTOMY Right 2011   lumpectomy. clear margins with reexision of  close margin. Rad tx and chemo  . CHOLECYSTECTOMY     partial    OB History   None      Home Medications    Prior to Admission medications   Medication Sig Start Date End Date Taking? Authorizing Provider  Acetaminophen 500 MG coapsule Take 2 capsules by mouth every 6 (six) hours as needed.    Yes [provider]  bisoprolol-hydrochlorothiazide (ZIAC) 5-6.25 MG per tablet TAKE 1 TABLET BY MOUTH EVERY DAY 01/07/15  Yes [provider]  FLUoxetine (PROZAC) 10 MG capsule 10 mg.  12/31/14  Yes [provider]  Melatonin 10 MG TABS Take by mouth.   Yes [provider]  predniSONE (DELTASONE) 10 MG tablet TAKE 3 TABLETS(30 MG) BY MOUTH DAILY WITH BREAKFAST 11/08/17  Yes Finnegan, Kathlene November, MD  mupirocin ointment (BACTROBAN) 2 % Apply 1 application topically 3 (three) times daily. 01/13/18   Lorin Picket, PA-C  sulfamethoxazole-trimethoprim (BACTRIM DS,SEPTRA DS) 800-160 MG tablet Take 1 tablet by mouth 2 (two) times daily. 01/13/18   Lorin Picket, PA-C    Family History Family History  Problem Relation Age of Onset  . Breast cancer Mother 3  . Pancreatic cancer Father     Social History Social History   Tobacco Use  .  Smoking status: Never Smoker  . Smokeless tobacco: Never Used  Substance Use Topics  . Alcohol use: Yes    Comment: occasional  . Drug use: No     Allergies   Cephalexin and Tape   Review of Systems Review of Systems  Constitutional: Positive for activity change. Negative for chills, fatigue and fever.  Skin: Positive for color change.  All other systems reviewed and are negative.    Physical Exam Triage Vital Signs ED Triage Vitals  Enc Vitals Group     BP 01/13/18 0840 (!) 117/51     Pulse Rate 01/13/18 0840 63     Resp 01/13/18 0840 18     Temp 01/13/18 0840 98.2 F (36.8 C)     Temp Source 01/13/18 0840 Oral     SpO2 01/13/18 0840 99 %     Weight 01/13/18 0836 300 lb (136.1 kg)     Height --       Head Circumference --      Peak Flow --      Pain Score 01/13/18 0836 7     Pain Loc --      Pain Edu? --      Excl. in [place]? --    No data found.  Updated Vital Signs BP (!) 117/51 (BP Location: Left Arm)   Pulse 63   Temp 98.2 F (36.8 C) (Oral)   Resp 18   Wt 300 lb (136.1 kg)   SpO2 99%   BMI 48.42 kg/m   Visual Acuity Right Eye Distance:   Left Eye Distance:   Bilateral Distance:    Right Eye Near:   Left Eye Near:    Bilateral Near:     Physical Exam  Constitutional: She is oriented to person, place, and time. She appears well-developed and well-nourished. No distress.  HENT:  Head: Normocephalic.  Eyes: Pupils are equal, round, and reactive to light. Right eye exhibits no discharge. Left eye exhibits no discharge.  Neck: Normal range of motion.  Musculoskeletal: Normal range of motion. She exhibits edema.  Neurological: She is alert and oriented to person, place, and time.  Skin: Skin is warm and dry. She is not diaphoretic. There is erythema.  Of the right forearm shows a blanchable erythema with sharp outline borders mostly of the volar aspect where she may have been lying on the area.No  Breakdown of the skin.  No puncture wounds are seen.  Neurologically she is intact distally.  Pulses are equal and full at the radial pulse.  Is no tenderness or pain with motion of her hand.  Psychiatric: She has a normal mood and affect. Her behavior is normal. Judgment and thought content normal.  Nursing note and vitals reviewed.        UC Treatments / Results  Labs (all labs ordered are listed, but only abnormal results are displayed) Labs Reviewed - No data to display  EKG None  Radiology No results found.  Procedures Procedures (including critical care time)  Medications Ordered in UC Medications - No data to display  Initial Impression / Assessment and Plan / UC Course  I have reviewed the triage vital signs and the nursing notes.  Pertinent labs &  imaging results that were available during my care of the patient were reviewed by me and considered in my medical decision making (see chart for details).     Plan: 1. Test/x-ray results and diagnosis reviewed with patient 2. rx as per orders; risks, benefits,  potential side effects reviewed with patient 3. Recommend supportive treatment with elevation as necessary for control of swelling.  Will treat with Septra DS and mupirocin which she had the last time.  Have her return in 2 days for wound check if there is progress in resolution.  At this point if it is ischemic, it appears to be very superficial and should resolve.  She has no neurological or vascular symptoms at the present time distally. 4. F/u prn if symptoms worsen or don't improve  Final Clinical Impressions(s) / UC Diagnoses   Final diagnoses:  Cellulitis of right upper extremity  Ischemia of right upper extremity  Cellulitis versus possible superficial pressure ischemia of forearm.   Discharge Instructions     Apply warm compresses 3 times daily for 10 minutes at a time.  Dry thoroughly and apply muprocin.    ED Prescriptions    Medication Sig Dispense Auth. Provider   sulfamethoxazole-trimethoprim (BACTRIM DS,SEPTRA DS) 800-160 MG tablet Take 1 tablet by mouth 2 (two) times daily. 14 tablet Crecencio Mc P, PA-C   mupirocin ointment (BACTROBAN) 2 % Apply 1 application topically 3 (three) times daily. 22 g Lorin Picket, PA-C     Controlled Substance Prescriptions [place] Controlled Substance Registry consulted? Not Applicable   Lorin Picket, PA-C 01/13/18 7741

## 2018-01-30 ENCOUNTER — Other Ambulatory Visit: Payer: BLUE CROSS/BLUE SHIELD

## 2018-02-09 ENCOUNTER — Inpatient Hospital Stay: Payer: BLUE CROSS/BLUE SHIELD | Admitting: Oncology

## 2018-02-18 NOTE — Progress Notes (Signed)
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  INTERVAL HISTORY: Patient returns to clinic today for routine six-month follow-up.  She has tapered her prednisone dose to 20 mg daily, but has been unable to reduce it further for fear of a flare of the skin on her right breast. She currently feels well and is asymptomatic.  She feels her lymphedema on her right arm is worse.  She has no neurologic complaints. She denies any recent fevers or illnesses. She has a good appetite and denies weight loss. She has no chest pain or shortness of breath. She denies any nausea, vomiting, constipation, or diarrhea. She has no urinary complaints.  Patient offers no further specific complaints today.  REVIEW OF SYSTEMS:   Review of Systems  Constitutional: Negative.  Negative for fever, malaise/fatigue and weight loss.  Respiratory: Negative.  Negative for cough and shortness of breath.   Cardiovascular: Negative.  Negative for chest pain and leg swelling.  Gastrointestinal: Negative.  Negative for abdominal pain and constipation.  Genitourinary: Negative.  Negative for dysuria.  Musculoskeletal: Negative.  Negative for myalgias.  Skin: Positive for rash.       Erythema  Neurological: Negative.  Negative for sensory change, focal weakness, weakness and headaches.  Psychiatric/Behavioral: Negative.  The patient is not nervous/anxious.     As per HPI. Otherwise, a complete review of systems is negatve.  PAST MEDICAL HISTORY: Past Medical History:  Diagnosis Date  . Breast cancer [facility]) 2011   right breast metastatic carcinoma   . Depression   . Hypertension   . Personal history of chemotherapy 2011   right  breast ca  . Personal history of radiation therapy 2011   right breast ca  . Psoriasis     PAST SURGICAL HISTORY: Past Surgical History:  Procedure Laterality Date  . APPENDECTOMY    . BREAST LUMPECTOMY Right 2011   lumpectomy. clear margins with reexision of close margin. Rad tx and chemo  . CHOLECYSTECTOMY     partial    FAMILY HISTORY: Reviewed and unchanged. No reported history of malignancy or chronic disease.     ADVANCED DIRECTIVES:    HEALTH MAINTENANCE: Social History   Tobacco Use  . Smoking status: Never Smoker  . Smokeless tobacco: Never Used  Substance Use Topics  . Alcohol use: Yes    Comment: occasional  . Drug use: No     Allergies  Allergen Reactions  . Cephalexin Rash  . Tape Itching    Current Outpatient Medications  Medication Sig Dispense Refill  . Acetaminophen 500 MG coapsule Take 2 capsules by mouth every 6 (six) hours as needed.     . bisoprolol-hydrochlorothiazide (ZIAC) 5-6.25 MG per tablet TAKE 1 TABLET BY MOUTH EVERY DAY    . doxycycline (ADOXA) 100 MG tablet Take 100 mg by mouth 2 (two) times daily between meals as needed.    Marland Kitchen FLUoxetine (PROZAC) 10 MG capsule 10 mg.     . Melatonin 10 MG TABS Take by mouth.    . predniSONE (DELTASONE) 10 MG tablet TAKE 3 TABLETS(30 MG) BY MOUTH DAILY WITH BREAKFAST (Patient taking differently: TAKE 2 TABLETS(30 MG) BY MOUTH DAILY WITH BREAKFAST) 270 tablet 0   No current facility-administered medications for  this visit.     OBJECTIVE: Vitals:   02/23/18 1438  BP: 132/86  Pulse: 64  Resp: 18  Temp: 98 F (36.7 C)     Body mass index is 50.35 kg/m.    ECOG FS:0 - Asymptomatic  General: Well-developed, well-nourished, no acute distress. Eyes: Pink conjunctiva, anicteric sclera. Breast: Right breast with mild erythema. Lungs: Clear to auscultation bilaterally. Heart: Regular rate and rhythm. No rubs, murmurs, or gallops. Abdomen: Soft, nontender, nondistended. No organomegaly noted,  normoactive bowel sounds. Musculoskeletal: No edema, cyanosis, or clubbing. Neuro: Alert, answering all questions appropriately. Cranial nerves grossly intact. Skin: No rashes or petechiae noted.  Erythema on right forearm unclear if cellulitis are related to patient's underlying psoriasis. Psych: Normal affect.    LAB RESULTS:  No results found for: NA, K, CL, CO2, GLUCOSE, BUN, CREATININE, CALCIUM, PROT, ALBUMIN, AST, ALT, ALKPHOS, BILITOT, GFRNONAA, GFRAA  No results found for: WBC, NEUTROABS, HGB, HCT, MCV, PLT   STUDIES: Mm Screening Breast Tomo Bilateral  Result Date: 02/19/2018 CLINICAL DATA:  Screening. EXAM: DIGITAL SCREENING BILATERAL MAMMOGRAM WITH TOMO AND CAD COMPARISON:  Previous exam(s). ACR Breast Density Category b: There are scattered areas of fibroglandular density. FINDINGS: There are no findings suspicious for malignancy. Images were processed with CAD. IMPRESSION: No mammographic evidence of malignancy. A result letter of this screening mammogram will be mailed directly to the patient. RECOMMENDATION: Screening mammogram in one year. (Code:SM-B-01Y) BI-RADS CATEGORY  1: Negative. Electronically Signed   By: Dina  Arceo M.D.   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  PLAN:    1. 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  Patient discontinued letrozole after 4+ years.  Her most recent mammogram on February 19, 2018 was reported as BI-RADS 1.  Repeat in July 2020.  Return to clinic in 6 months for routine evaluation. 2. Osteoporosis: Bone mineral density on November 28, 2013 was reported as T score of -2.6.  Patient no longer takes Fosamax. Patient has been instructed to continue calcium and vitamin D twice daily.  Previously she refused additional bone mineral densities.  Patient expressed  understanding of the long-term side effects of osteoporosis with using chronic prednisone.   3.  Anxiety: Continue Prozac 20 mg as well as Xanax 0.25 mg as needed. 4.  Breast erythema: Discontinue letrozole as above.  Patient is currently taking 20 mg prednisone daily.  Continue to taper slowly as indicated.  Patient has been evaluated by radiation oncology, dermatology, and surgery.  Patient was given a referral to [facility] dermatology, but declined to go to this visit. She expressed understanding of the risks of chronic steroid use, but wishes to continue treatment.  5.  Lymphedema: Right arm.  Patient previously refused referral to lymphedema clinic, but now has agreed to go.  Referral has been placed.  Patient expressed understanding and was in agreement with this plan. She also understands that She can call clinic at any time with any questions, concerns, or complaints.   Breast cancer   Staging form: Breast, AJCC 7th Edition     Clinical stage from 01/26/2015: Stage IIA (T1c, N1, M0) - Signed by  J , MD on 01/26/2015    J , MD   03/01/2018 2:37 PM            

## 2018-02-19 ENCOUNTER — Ambulatory Visit
Admission: RE | Admit: 2018-02-19 | Discharge: 2018-02-19 | Disposition: A | Discharge status: Home / Self Care | Payer: BLUE CROSS/BLUE SHIELD | Source: Ambulatory Visit | Attending: Oncology | Admitting: Oncology

## 2018-02-19 DIAGNOSIS — C50919 Malignant neoplasm of unspecified site of unspecified female breast: Secondary | ICD-10-CM | POA: Insufficient documentation

## 2018-02-23 ENCOUNTER — Inpatient Hospital Stay: Payer: BLUE CROSS/BLUE SHIELD | Attending: Oncology | Admitting: Oncology

## 2018-02-23 VITALS — BP 132/86 | HR 64 | Temp 98.0°F | Resp 18 | Wt 312.0 lb

## 2018-02-23 DIAGNOSIS — M818 Other osteoporosis without current pathological fracture: Secondary | ICD-10-CM | POA: Diagnosis not present

## 2018-02-23 DIAGNOSIS — Z170 Estrogen receptor positive status [ER+]: Secondary | ICD-10-CM | POA: Insufficient documentation

## 2018-02-23 DIAGNOSIS — F419 Anxiety disorder, unspecified: Secondary | ICD-10-CM | POA: Insufficient documentation

## 2018-02-23 DIAGNOSIS — C50411 Malignant neoplasm of upper-outer quadrant of right female breast: Secondary | ICD-10-CM | POA: Diagnosis present

## 2018-02-23 DIAGNOSIS — N6459 Other signs and symptoms in breast: Secondary | ICD-10-CM | POA: Insufficient documentation

## 2018-02-23 NOTE — Progress Notes (Signed)
Here for Follow up visit. Breast cancer. Patient recently treated for celluilits in R wrist/forearm. Breast rash stays swollen, Radiation recall vs cellulitis.  Patient feels like she has lymphedema of R arm. Patient did not have dexascan.

## 2018-03-01 ENCOUNTER — Other Ambulatory Visit: Payer: Self-pay | Admitting: *Deleted

## 2018-03-01 DIAGNOSIS — I890 Lymphedema, not elsewhere classified: Secondary | ICD-10-CM

## 2018-03-08 ENCOUNTER — Encounter: Payer: Self-pay | Admitting: Occupational Therapy

## 2018-03-08 ENCOUNTER — Other Ambulatory Visit: Payer: Self-pay

## 2018-03-08 ENCOUNTER — Ambulatory Visit: Payer: BLUE CROSS/BLUE SHIELD | Attending: Oncology | Admitting: Occupational Therapy

## 2018-03-08 DIAGNOSIS — M79621 Pain in right upper arm: Secondary | ICD-10-CM | POA: Diagnosis present

## 2018-03-08 DIAGNOSIS — L905 Scar conditions and fibrosis of skin: Secondary | ICD-10-CM

## 2018-03-08 DIAGNOSIS — I890 Lymphedema, not elsewhere classified: Secondary | ICD-10-CM | POA: Diagnosis not present

## 2018-03-08 NOTE — Patient Instructions (Signed)
Pt to wear soft tubigrip small and R isotoner glove small on R hand and forearm - most all the time  And order Jovipak breast pad - unilateral post - mastectomy pad at [place] provided pt temp - Komprex foam to wear under bra or camisole at home - for compression on R breast , under arm and over scapula   OT will contact - rep for pump  And remeasure next time - if forearm decrease -will get measured for daytime compression garments for arm and hand

## 2018-03-08 NOTE — Therapy (Signed)
[place] [facility] [address] [place], [address] Phone: [phone]   Fax:  [phone]  Occupational Therapy Evaluation  Patient Details  Name: Tiffany Wilson MRN: [id number] Date of Birth: 1956/04/19 Referring Provider: Grayland Ormond   Encounter Date: 03/08/2018  OT End of Session - 03/08/18 1232    Visit Number  1    Number of Visits  6    Date for OT Re-Evaluation  04/19/18    OT Start Time  0840    OT Stop Time  0934    OT Time Calculation (min)  54 min    Activity Tolerance  Patient tolerated treatment well    Behavior During Therapy  [facility] for tasks assessed/performed       Past Medical History:  Diagnosis Date  . Breast cancer [facility]) 2011   right breast metastatic carcinoma   . Depression   . Hypertension   . Personal history of chemotherapy 2011   right breast ca  . Personal history of radiation therapy 2011   right breast ca  . Psoriasis     Past Surgical History:  Procedure Laterality Date  . APPENDECTOMY    . BREAST LUMPECTOMY Right 2011   lumpectomy. clear margins with reexision of close margin. Rad tx and chemo  . CHOLECYSTECTOMY     partial    There were no vitals filed for this visit.  Subjective Assessment - 03/08/18 0947    Subjective   I had several times cellulitis on my R breast and now this past 6 months - had 2 episodes in my R forearm - that I ended up in ER - I have Psoriasis - my breast stays full , swollen and hard - my arm swelling comes and goes still depending  on what I do - if I use it a lot - my arm is worse - it feels heavy , and some pain end of day - at the worse about 6/10     Patient Stated Goals  I want to get the lymphedema under control that I do not get cellulitis anymore -and can come off the low dose prednisone     Currently in Pain?  Yes    Pain Score  2     Pain Location  Arm    Pain Orientation  Right    Pain Descriptors / Indicators  Aching    Pain  Type  Acute pain    Pain Onset  More than a month ago    Pain Frequency  Intermittent    Aggravating Factors   increase use or if arm is more swollen         [facility] OT Assessment - 03/08/18 0001      Assessment   Medical Diagnosis  R upper quadrant lymphedema     Referring Provider  Finnegan    Onset Date/Surgical Date  02/12/13    Hand Dominance  Right      Precautions   Precaution Comments  -- R upper quadrant lymphedema       Home  Environment   Lives With  Spouse      Prior Function   Leisure  Homemaker - do own house work , use a lot the computer or IPAD       AROM   Overall AROM Comments  Bilateral UE AROM WFL       Strength   Overall Strength Comments  bilateral strength shoulders  4-/5 ,  Tricep 4-/5        LYMPHEDEMA/ONCOLOGY QUESTIONNAIRE - 03/08/18 0952      Type   Cancer Type  R breast CA       Surgeries   Lumpectomy Date  -- 2011    Number Lymph Nodes Removed  1      Treatment   Past Chemotherapy Treatment  -- 2011    Past Radiation Treatment  -- 2011      What other symptoms do you have   Are you Having Heaviness or Tightness  Yes    Are you having Pain  Yes    Are you having pitting edema  No    Do you have infections  -- had several cellulitis episodes in R brreast and forearm       Lymphedema Stage   Stage  -- Stage 2 in breast - and Stage 1 in arm       Right Upper Extremity Lymphedema   15 cm Proximal to Olecranon Process  57 cm    10 cm Proximal to Olecranon Process  55.3 cm    Olecranon Process  34 cm    15 cm Proximal to Ulnar Styloid Process  34.5 cm    10 cm Proximal to Ulnar Styloid Process  32.5 cm    Just Proximal to Ulnar Styloid Process  25.5 cm    Across Hand at [employer]  21 cm    At [place] of 2nd Digit  7 cm    At [facility] of Thumb  6.6 cm      Left Upper Extremity Lymphedema   15 cm Proximal to Olecranon Process  57 cm    10 cm Proximal to Olecranon Process  56.3 cm    Olecranon Process  34.5 cm    15 cm Proximal to  Ulnar Styloid Process  34 cm    10 cm Proximal to Ulnar Styloid Process  31 cm    Just Proximal to Ulnar Styloid Process  23 cm    Across Hand at [employer]  20 cm    At [place] of 2nd Digit  6.9 cm    At [facility] of Thumb  6.9 cm         Pt to wear soft tubigrip small and R isotoner glove small on R hand and forearm - most all the time  And order Jovipak breast pad - unilateral post - mastectomy pad at [employer]  And provided pt temp - Komprex foam to wear under bra or camisole at home - for compression on R breast , under arm and over scapula   OT will contact - rep for pump  And remeasure next time - if forearm decrease -will get measured for daytime compression garments and glove             OT Education - 03/08/18 1232    Education Details  findings for eval and homeprogram discuss     Person(s) Educated  Patient;Spouse    Methods  Explanation;Demonstration;Tactile cues;Handout    Comprehension  Verbalized understanding;Returned demonstration       OT Short Term Goals - 03/08/18 1241      OT SHORT TERM GOAL #1   Title  Pt to be ind in HEP for next 1-2 wks to decrease R hand and forearm measurements by at least 1 cm -and breast feels little softer     Baseline  no knowledge     Time  2  Period  Weeks    Status  New    Target Date  03/22/18        OT Long Term Goals - 03/08/18 1241      OT LONG TERM GOAL #1   Title  Pt to be independent in wearing of compression sleeve , glove and use of pump to decrease circumference in R forearm and  fibrosis in R  breast     Baseline  no garments or pumps - several cellulitis episodes and pain 7/10 in shoulder -and breast fibrosis - scar tissue , blisters for psorisis     Time  6    Period  Weeks    Status  New    Target Date  04/19/18      OT LONG TERM GOAL #2   Title  Pt pain in R arm decrease to 3/10 at the worse , and less blisters on and under R breast     Baseline  pain increase with use and increase swelling in  arm -and blisters under and on breast - psoriasis     Time  6    Period  Weeks    Status  New    Target Date  04/19/18      OT LONG TERM GOAL #3   Title  Pt verbalize precautions on how to protect her R upper quadrant     Baseline  no knowledge- had bug bits , scratch psoriasis , and dog scratch her breast before    Time  2    Period  Weeks    Status  New    Target Date  03/22/18            Plan - 03/08/18 1234    Clinical Impression Statement  Pt refer to OT for lymphedema in R upper quadrant - pt had several episodes in the last 5 yrs  of cellulitis in R breast - but in the last 6 months had 2 episodes of cellulitis in R forearm - pt show incrase tightness, fibrosis, scar tissue  in R breast and then R hand incrase 1 cm , wrist 2.5 cm , forearm 1.5 cm - and some fibrosis in R forearm and wrist - pt  bilateral UE shoulder AROM WFL but decrease strength in shoudler and tricep - pt show stage 2 lympedema in R breast and then stage 1 in R UE - pt can benefit from Pump for  R  thoracic  and UE lymphedema and jovipak breast pad - and when forearm measurements decrease will recommend getting measured for   [employer] soft  sleeve and glove - pt     Occupational performance deficits (Please refer to evaluation for details):  ADL's;IADL's;Play;Leisure    Rehab Potential  Good    Current Impairments/barriers affecting progress:  Had breast CA with radiation in 2011 - and had several episodes of cellulitis in R breast for the last 5 yrs and last 6 months in R arm now - pt has psoriasis     OT Frequency  1x / week    OT Duration  6 weeks    OT Treatment/Interventions  Manual lymph drainage;Self-care/ADL training;DME and/or AE instruction;Manual Therapy;Compression bandaging    Plan  assess progress with compression on R forearm and hand - if decrease - send for measurement for garments    Clinical Decision Making  Limited treatment options, no task modification necessary    OT Home Exercise  Plan  see pt instruction  Consulted and Agree with Plan of Care  Patient;Family member/caregiver       Patient will benefit from skilled therapeutic intervention in order to improve the following deficits and impairments:  Increased edema, Pain, Decreased scar mobility, Decreased strength, Impaired UE functional use, Decreased knowledge of precautions  Visit Diagnosis: Lymphedema, not elsewhere classified - Plan: Ot plan of care cert/re-cert  Pain of right upper arm - Plan: Ot plan of care cert/re-cert  Scar condition and fibrosis of skin - Plan: Ot plan of care cert/re-cert    Problem List Patient Active Problem List   Diagnosis Date Noted  . Breast cancer of upper-outer quadrant of right female breast ([place]) 01/26/2015  . Clinical depression 03/06/2014  . BP (high blood pressure) 03/06/2014  . Adiposity 03/06/2014  . Psoriasis 03/06/2014    Rosalyn Gess OTR/L,CLT 03/08/2018, 12:50 PM  [place] [facility] [address], [place], [address] Phone: [phone]   Fax:  [phone]  Name: Tiffany Wilson MRN: [id number] Date of Birth: 30-Mar-1956

## 2018-03-13 ENCOUNTER — Other Ambulatory Visit: Payer: Self-pay | Admitting: *Deleted

## 2018-03-13 ENCOUNTER — Other Ambulatory Visit: Payer: Self-pay | Admitting: Oncology

## 2018-03-15 ENCOUNTER — Ambulatory Visit
Admission: EM | Admit: 2018-03-15 | Discharge: 2018-03-15 | Disposition: A | Discharge status: Home / Self Care | Payer: BLUE CROSS/BLUE SHIELD | Attending: Emergency Medicine | Admitting: Emergency Medicine

## 2018-03-15 ENCOUNTER — Encounter: Payer: Self-pay | Admitting: Emergency Medicine

## 2018-03-15 ENCOUNTER — Other Ambulatory Visit: Payer: Self-pay

## 2018-03-15 DIAGNOSIS — R002 Palpitations: Secondary | ICD-10-CM

## 2018-03-15 DIAGNOSIS — Z853 Personal history of malignant neoplasm of breast: Secondary | ICD-10-CM | POA: Diagnosis not present

## 2018-03-15 DIAGNOSIS — Z923 Personal history of irradiation: Secondary | ICD-10-CM | POA: Insufficient documentation

## 2018-03-15 DIAGNOSIS — R0602 Shortness of breath: Secondary | ICD-10-CM | POA: Diagnosis not present

## 2018-03-15 DIAGNOSIS — L409 Psoriasis, unspecified: Secondary | ICD-10-CM | POA: Insufficient documentation

## 2018-03-15 DIAGNOSIS — F329 Major depressive disorder, single episode, unspecified: Secondary | ICD-10-CM | POA: Diagnosis not present

## 2018-03-15 DIAGNOSIS — I951 Orthostatic hypotension: Secondary | ICD-10-CM

## 2018-03-15 DIAGNOSIS — Z9221 Personal history of antineoplastic chemotherapy: Secondary | ICD-10-CM | POA: Diagnosis not present

## 2018-03-15 DIAGNOSIS — I10 Essential (primary) hypertension: Secondary | ICD-10-CM | POA: Diagnosis not present

## 2018-03-15 LAB — CBC WITH DIFFERENTIAL/PLATELET
Basophils Absolute: 0.1 10*3/uL (ref 0–0.1)
Basophils Relative: 1 %
EOS PCT: 0 %
Eosinophils Absolute: 0 10*3/uL (ref 0–0.7)
HEMATOCRIT: 45 % (ref 35.0–47.0)
Hemoglobin: 15 g/dL (ref 12.0–16.0)
LYMPHS ABS: 1.5 10*3/uL (ref 1.0–3.6)
LYMPHS PCT: 24 %
MCH: 31.6 pg (ref 26.0–34.0)
MCHC: 33.4 g/dL (ref 32.0–36.0)
MCV: 94.6 fL (ref 80.0–100.0)
MONO ABS: 0.4 10*3/uL (ref 0.2–0.9)
MONOS PCT: 7 %
NEUTROS ABS: 4.2 10*3/uL (ref 1.4–6.5)
Neutrophils Relative %: 68 %
PLATELETS: 252 10*3/uL (ref 150–440)
RBC: 4.76 MIL/uL (ref 3.80–5.20)
RDW: 13.8 % (ref 11.5–14.5)
WBC: 6.2 10*3/uL (ref 3.6–11.0)

## 2018-03-15 LAB — BASIC METABOLIC PANEL
Anion gap: 13 (ref 5–15)
BUN: 27 mg/dL — ABNORMAL HIGH (ref 8–23)
CALCIUM: 9.9 mg/dL (ref 8.9–10.3)
CHLORIDE: 103 mmol/L (ref 98–111)
CO2: 27 mmol/L (ref 22–32)
CREATININE: 0.63 mg/dL (ref 0.44–1.00)
GFR calc Af Amer: >60 mL/min (ref >60)
GFR calc non Af Amer: >60 mL/min (ref >60)
GLUCOSE: 124 mg/dL — AB (ref 70–99)
Potassium: 4 mmol/L (ref 3.5–5.1)
Sodium: 143 mmol/L (ref 135–145)

## 2018-03-15 LAB — FIBRIN DERIVATIVES D-DIMER (ARMC ONLY): Fibrin derivatives D-dimer (ARMC): 591.7 ng/mL (FEU) — ABNORMAL HIGH (ref 0.00–499.00)

## 2018-03-15 NOTE — Discharge Instructions (Addendum)
Your CBC and basic metabolic panel are fine.  Your d-dimer, which is a test looking for blood clots, is still pending.  We will call you if it comes back positive and you will need to go immediately to the emergency department.  Or you can call here in about 30 minutes and we should have the results by then.  Push plenty of non-sugary, non-caffeinated beverages such as caffeinated herbal tea or plain water.  Go immediately to the ER if your symptoms return, more for the signs and symptoms we discussed.

## 2018-03-15 NOTE — ED Provider Notes (Signed)
HPI  SUBJECTIVE:  Tiffany Wilson is a 62 y.o. female who presents with 3 hours of intermittent palpitations described as "my heart fluttering" when she stands up and with exertion.  Palpitations would last approximately a minute.  The symptoms are accompanied with shortness of breath and substernal/upper throat chest pain described as pressure.  This started shortly after eating a greasy meal.  She states that the symptoms seem to happen with exertion, and with going from sitting to standing, and they are better when she relaxes and takes deep breaths in.  She has not tried anything else for this.  She also reports belching and increased flatulence during this time.  There is no chest heaviness.  No radiation up her neck, down her arm or through to her back.  No water brash.  No lightheadedness, dizziness, syncope.  No nausea, diaphoresis, abdominal pain.  No DOE.  No coughing, wheezing.  No hemoptysis, prolonged immobilization, although she is sedentary.  No surgery in the past 4 weeks.  No calf pain or swelling.  Patient states that she has been on a keto diet for several months, and drinks mostly diet peach tea.  Does not drink much water.  She denies drinking more caffeine than usual today.  She normally has 5 or 6 cups of coffee in the morning. no vomiting, diarrhea.  No decongestants.  No supplements.  No weight loss products.  She has never had symptoms like this before.  She recently restarted doxycycline and had her prednisone increased from 20 to 30 mg 1 week ago for the lymphedema.  She states that she was under increased amount of stress as she lost an aunt this morning.  She has a past medical history of breast cancer and has been cancer free for 8 years.  She also has a history of recurrent cellulitis, lymphedema, hypertension.  No history of hyperthyroidism, PE, DVT, hypercholesterolemia, diabetes, smoking, atrial fibrillation, arrhythmia.  Family history significant for mother with fatal MI at  age 46.  Sister has atrial fibrillation.  XBM:[id number], Chrissie Noa, MD  Past Medical History:  Diagnosis Date  . Breast cancer [facility]) 2011   right breast metastatic carcinoma   . Depression   . Hypertension   . Personal history of chemotherapy 2011   right breast ca  . Personal history of radiation therapy 2011   right breast ca  . Psoriasis     Past Surgical History:  Procedure Laterality Date  . BREAST LUMPECTOMY Right 2011   lumpectomy. clear margins with reexision of close margin. Rad tx and chemo  . CHOLECYSTECTOMY     partial    Family History  Problem Relation Age of Onset  . Breast cancer Mother 92  . Pancreatic cancer Father     Social History   Tobacco Use  . Smoking status: Never Smoker  . Smokeless tobacco: Never Used  Substance Use Topics  . Alcohol use: Yes    Comment: occasional  . Drug use: No    No current facility-administered medications for this encounter.   Current Outpatient Medications:  .  Acetaminophen 500 MG coapsule, Take 2 capsules by mouth every 6 (six) hours as needed. , Disp: , Rfl:  .  bisoprolol-hydrochlorothiazide (ZIAC) 5-6.25 MG per tablet, TAKE 1 TABLET BY MOUTH EVERY DAY, Disp: , Rfl:  .  doxycycline (ADOXA) 100 MG tablet, Take 100 mg by mouth 2 (two) times daily between meals as needed., Disp: , Rfl:  .  FLUoxetine (PROZAC) 10 MG  capsule, 10 mg. , Disp: , Rfl:  .  Melatonin 10 MG TABS, Take by mouth., Disp: , Rfl:  .  predniSONE (DELTASONE) 10 MG tablet, TAKE 3 TABLETS(30 MG) BY MOUTH DAILY WITH BREAKFAST, Disp: 21 tablet, Rfl: 0  Allergies  Allergen Reactions  . Cephalexin Rash  . Tape Itching     ROS  As noted in HPI.   Physical Exam  BP 122/79 (BP Location: Left Arm)   Pulse 100   Temp 98.3 F (36.8 C) (Oral)   Resp 18   Ht 5\' 6"  (1.676 m)   Wt (!) 315 lb (142.9 kg)   SpO2 95%   BMI 50.84 kg/m  Orthostatic VS for the past 24 hrs:  BP- Lying Pulse- Lying BP- Sitting Pulse- Sitting BP- Standing at 0 minutes  Pulse- Standing at 0 minutes  03/15/18 1914 132/79 72 131/80 89 (!) 131/95 106      Constitutional: Well developed, well nourished, no acute distress Eyes:  EOMI, conjunctiva normal bilaterally HENT: Normocephalic, atraumatic,mucus membranes moist Respiratory: Normal inspiratory effort, lungs clear bilaterally. Cardiovascular: Normal rate, regular rhythm, no murmurs, rubs, gallops.  RP 2+ and regular. GI: nondistended skin: No rash, skin intact Musculoskeletal: no deformities calves symmetric, nontender, no edema. Neurologic: Alert & oriented x 3, no focal neuro deficits Psychiatric: Speech and behavior appropriate   ED Course   Medications - No data to display  Orders Placed This Encounter  Procedures  . CBC with Differential    Standing Status:   Standing    Number of Occurrences:   1  . Fibrin derivatives D-Dimer    Standing Status:   Standing    Number of Occurrences:   1  . Basic metabolic panel    Standing Status:   Standing    Number of Occurrences:   1  . Cardiac monitoring    Standing Status:   Standing    Number of Occurrences:   1  . Orthostatic vital signs    Standing Status:   Standing    Number of Occurrences:   1  . ED EKG    Palpitations    Standing Status:   Standing    Number of Occurrences:   1    Order Specific Question:   Reason for Exam    Answer:   Other (See Comments)  . EKG 12-Lead    Standing Status:   Standing    Number of Occurrences:   1    Results for orders placed or performed during the hospital encounter of 03/15/18 (from the past 24 hour(s))  CBC with Differential     Status: None   Collection Time: 03/15/18  8:04 PM  Result Value Ref Range   WBC 6.2 3.6 - 11.0 K/uL   RBC 4.76 3.80 - 5.20 MIL/uL   Hemoglobin 15.0 12.0 - 16.0 g/dL   HCT 45.0 35.0 - 47.0 %   MCV 94.6 80.0 - 100.0 fL   MCH 31.6 26.0 - 34.0 pg   MCHC 33.4 32.0 - 36.0 g/dL   RDW 13.8 11.5 - 14.5 %   Platelets 252 150 - 440 K/uL   Neutrophils Relative % 68 %    Neutro Abs 4.2 1.4 - 6.5 K/uL   Lymphocytes Relative 24 %   Lymphs Abs 1.5 1.0 - 3.6 K/uL   Monocytes Relative 7 %   Monocytes Absolute 0.4 0.2 - 0.9 K/uL   Eosinophils Relative 0 %   Eosinophils Absolute 0.0 0 - 0.7 K/uL  Basophils Relative 1 %   Basophils Absolute 0.1 0 - 0.1 K/uL  Fibrin derivatives D-Dimer     Status: Abnormal   Collection Time: 03/15/18  8:04 PM  Result Value Ref Range   Fibrin derivatives D-dimer ([facility]) 591.70 (H) 0.00 - 499.00 ng/mL (FEU)  Basic metabolic panel     Status: Abnormal   Collection Time: 03/15/18  8:04 PM  Result Value Ref Range   Sodium 143 135 - 145 mmol/L   Potassium 4.0 3.5 - 5.1 mmol/L   Chloride 103 98 - 111 mmol/L   CO2 27 22 - 32 mmol/L   Glucose, Bld 124 (H) 70 - 99 mg/dL   BUN 27 (H) 8 - 23 mg/dL   Creatinine, Ser 0.63 0.44 - 1.00 mg/dL   Calcium 9.9 8.9 - 10.3 mg/dL   GFR calc non Af Amer >60 >60 mL/min   GFR calc Af Amer >60 >60 mL/min   Anion gap 13 5 - 15   No results found.  ED Clinical Impression  Palpitations  Orthostasis   ED Assessment/Plan  Normal sinus rhythm, rate 93.  Normal axis, normal intervals.  Single PVC.  No hypertrophy.  No ST-T wave changes compared to EKG from November 2011.  She was mildly symptomatic when the EKG was obtained.  Rhythm strip obtained.  Sinus rhythm with occasional PVC.  No bigeminy.  No arrhythmia.  Patient is orthostatic.  Heart rate goes from 70-106.  However this did not reproduce her symptoms.  Her heart rate is 100 and she is 95% on room air.  Previously her heart rate was in the 60s and she was satting 97-98 on RA.   Checking CBC, BMP, d-dimer.   CBC, BMP normal.  However d-dimer is positive, elevated at 597.  Contacted the patient's husband at [phone] notified him of the positive d-dimer.  Discussed with him the concern for PE, and and advised them to go to the ER.  They state that they will go to the [facility] or [facility] ER.  Discussed labs, MDM, treatment plan, and  plan for follow-up with patient. patient agrees with plan.   No orders of the defined types were placed in this encounter.   *This clinic note was created using Dragon dictation software. Therefore, there may be occasional mistakes despite careful proofreading.   ?   Melynda Ripple, MD 03/15/18 2110

## 2018-03-15 NOTE — ED Triage Notes (Signed)
Pt present today c/o palpitations. Pt started feeling her heart "fluttering" about 4:00 today and she had a "odd feeling" in her chest. Lasted a few minutes and went anyway. She has been under alot of stress today with a passing of her aunt. She has not taken her Prozac in 3 days. She only feels the palpitations when she stands up. She is taking prednisone right now.

## 2018-03-16 ENCOUNTER — Encounter: Payer: BLUE CROSS/BLUE SHIELD | Admitting: Occupational Therapy

## 2018-03-19 ENCOUNTER — Ambulatory Visit: Payer: BLUE CROSS/BLUE SHIELD | Attending: Oncology | Admitting: Occupational Therapy

## 2018-03-19 DIAGNOSIS — M79621 Pain in right upper arm: Secondary | ICD-10-CM | POA: Insufficient documentation

## 2018-03-19 DIAGNOSIS — L905 Scar conditions and fibrosis of skin: Secondary | ICD-10-CM | POA: Diagnosis present

## 2018-03-19 DIAGNOSIS — I890 Lymphedema, not elsewhere classified: Secondary | ICD-10-CM | POA: Diagnosis not present

## 2018-03-19 MED ORDER — PREDNISONE 10 MG PO TABS: ORAL_TABLET | ORAL | 1 refills | Status: DC

## 2018-03-19 NOTE — Patient Instructions (Signed)
   Pt to wear soft tubigrip small and R isotoner glove small on R hand and forearm - as much as possible   And order Jovipak breast pad - unilateral post - mastectomy pad at [employer]  - if she gets it - phone for next appt  And provided pt temp - Komprex foam to wear under bra or camisole at home - for compression on R breast , under arm and over scapula  And go ahead and order daytime [employer] Elvarex Soft sleeve and glove for R UE

## 2018-03-19 NOTE — Therapy (Signed)
[facility] [address] [place], [address] Phone: [phone]   Fax:  [phone]  Occupational Therapy Treatment  Patient Details  Name: Tiffany Wilson MRN: [id number] Date of Birth: [birth year] Referring Provider: Grayland Ormond   Encounter Date: 03/19/2018  OT End of Session - 03/19/18 1056    Visit Number  2    Number of Visits  6    Date for OT Re-Evaluation  04/19/18    OT Start Time  1019    OT Stop Time  1050    OT Time Calculation (min)  31 min    Activity Tolerance  Patient tolerated treatment well    Behavior During Therapy  [facility] for tasks assessed/performed       Past Medical History:  Diagnosis Date  . Breast cancer [facility]) 2011   right breast metastatic carcinoma   . Depression   . Hypertension   . Personal history of chemotherapy 2011   right breast ca  . Personal history of radiation therapy 2011   right breast ca  . Psoriasis     Past Surgical History:  Procedure Laterality Date  . BREAST LUMPECTOMY Right 2011   lumpectomy. clear margins with reexision of close margin. Rad tx and chemo  . CHOLECYSTECTOMY     partial    There were no vitals filed for this visit.  Subjective Assessment - 03/19/18 1051    Subjective   Pt had on 8/1 urgent care visit and ER visit for heart palpation and SOC  - was negative for PE - pt think it as stress and maybe panic attack - report she had break out after last time  when OT did some gentle  palpating her R breast  for  fibrosis - turned red while breast and under - in 2-3 hrs - so not sure if can use the pump - did not order anything yet - the lady was not at [facility] that day - cannot wear the glove on her hand a lot - goes a lot to bathroom - did wear forearm sleeve about 50% of time     Patient Stated Goals  I want to get the lymphedema under control that I do not get cellulitis anymore -and can come off the low dose prednisone     Currently in Pain?   No/denies          LYMPHEDEMA/ONCOLOGY QUESTIONNAIRE - 03/19/18 1022      Right Upper Extremity Lymphedema   15 cm Proximal to Olecranon Process  57 cm    10 cm Proximal to Olecranon Process  55.3 cm    Olecranon Process  33.5 cm    15 cm Proximal to Ulnar Styloid Process  34 cm    10 cm Proximal to Ulnar Styloid Process  32 cm    Just Proximal to Ulnar Styloid Process  24.5 cm    Across Hand at [employer]  21.4 cm       Measure R UE circumference - see flowsheet - decrease in wrist to elbow  Pt ready to get measured for daytime compression - And go ahead and order daytime Job[employer] El[employer] Soft sleeve and glove for R UE     Pt report she wore the compression on forearm about 50% of time but glove not a lot - per her going to bathroom a lot    Pt to wear soft tubigrip small and R isotoner glove small on  R hand and forearm - as much as possible  - cut 2 new ones  And she need to go ahead and  order Jovipak breast pad - unilateral post - mastectomy pad at [facility]  - if she gets it - phone for next appt  And provided pt temp - Komprex foam to wear under bra or camisole at home - for compression on R breast            OT Education - 03/19/18 1056    Education Details  wearing of temp compression - and garments to order     Person(s) Educated  Patient;Spouse    Methods  Explanation;Demonstration    Comprehension  Returned demonstration       OT Short Term Goals - 03/08/18 1241      OT SHORT TERM GOAL #1   Title  Pt to be ind in HEP for next 1-2 wks to decrease R hand and forearm measurements by at least 1 cm -and breast feels little softer     Baseline  no knowledge     Time  2    Period  Weeks    Status  New    Target Date  03/22/18        OT Long Term Goals - 03/08/18 1241      OT LONG TERM GOAL #1   Title  Pt to be independent in wearing of compression sleeve , glove and use of pump to decrease circumference in R forearm and  fibrosis in R  breast      Baseline  no garments or pumps - several cellulitis episodes and pain 7/10 in shoulder -and breast fibrosis - scar tissue , blisters for psorisis     Time  6    Period  Weeks    Status  New    Target Date  04/19/18      OT LONG TERM GOAL #2   Title  Pt pain in R arm decrease to 3/10 at the worse , and less blisters on and under R breast     Baseline  pain increase with use and increase swelling in arm -and blisters under and on breast - psoriasis     Time  6    Period  Weeks    Status  New    Target Date  04/19/18      OT LONG TERM GOAL #3   Title  Pt verbalize precautions on how to protect her R upper quadrant     Baseline  no knowledge- had bug bits , scratch psoriasis , and dog scratch her breast before    Time  2    Period  Weeks    Status  New    Target Date  03/22/18            Plan - 03/19/18 1057    Clinical Impression Statement  Pt show decrease in R forearm measurements - and is ready to get measured for daytime compression sleeve - Jobst Elvarex soft sleeve and glove - and to wear in meantime the Soft tubigrip and glove on hand and forearm as much as she can -  she showed picture of her breast and under her breast that turned blood red after palpation by OT of fibrosis on R breast last time- pt  at this time not candidate for pump - will hold off on that and self MLD and do jovipak breast pad under camisole - to wera 2-3 hrs at  time to tolerance - still stage 2 lymphedema in R breast and thoracic - but Stage 1 in UE   - pt to contact me when she gets her jovipak breast pad     Occupational performance deficits (Please refer to evaluation for details):  ADL's;IADL's;Play;Leisure    Rehab Potential  Good    Current Impairments/barriers affecting progress:  Had breast CA with radiation in 2011 - and had several episodes of cellulitis in R breast for the last 5 yrs and last 6 months in R arm now - pt has psoriasis     OT Frequency  1x / week    OT Duration  6 weeks    OT  Treatment/Interventions  Manual lymph drainage;Self-care/ADL training;DME and/or AE instruction;Manual Therapy;Compression bandaging    Plan  assess progress with compression on R forearm and hand - if decrease - send for measurement for garments    Clinical Decision Making  Limited treatment options, no task modification necessary    OT Home Exercise Plan  see pt instruction     Consulted and Agree with Plan of Care  Patient       Patient will benefit from skilled therapeutic intervention in order to improve the following deficits and impairments:  Increased edema, Pain, Decreased scar mobility, Decreased strength, Impaired UE functional use, Decreased knowledge of precautions  Visit Diagnosis: Lymphedema, not elsewhere classified  Pain of right upper arm  Scar condition and fibrosis of skin    Problem List Patient Active Problem List   Diagnosis Date Noted  . Breast cancer of upper-outer quadrant of right female breast ([place]) 01/26/2015  . Clinical depression 03/06/2014  . BP (high blood pressure) 03/06/2014  . Adiposity 03/06/2014  . Psoriasis 03/06/2014    Rosalyn Gess OTR/l,CLT 03/19/2018, 11:00 AM  [facility] [address], [place], [address] Phone: [phone]   Fax:  [phone]  Name: JAHYRA SUKUP MRN: [id number] Date of Birth: 1956-08-08

## 2018-03-21 ENCOUNTER — Telehealth: Payer: Self-pay | Admitting: Occupational Therapy

## 2018-03-21 NOTE — Telephone Encounter (Signed)
Pt phone this date - she had reaction to the soft tubigrip  Compression that was fitted on her hand and forearm Monday - her forearm is swollen and red - and hand swollen too.   She had same type of reaction few hours after her OT eval - that her breast got blood red and underneath - after OT did very gentle manual palpation on R breast for not even minute -   Discuss with pt that if she has fever , chills or other signs - she need to go to urgent care or ER .   She had several  Episodes of cellulites thru the years and several the last few months .  She do not present like normal lymphedema pt's   Would recommend maybe a referral to infection control MD    Pt to hold of on ordering any compression garments at this time. To contact me - to let me know what course is   [employer] OTR/L,CLT

## 2018-05-08 ENCOUNTER — Other Ambulatory Visit: Payer: Self-pay | Admitting: Oncology

## 2018-08-25 NOTE — Progress Notes (Signed)
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  INTERVAL HISTORY: Patient returns to clinic today for routine 105-monthevaluation.  She recently has increased her daily steroid use to 30 mg daily, but plans to taper back to 20 mg in the near future.  She has been unable to reduce it further for fear of a flare of the skin on her right breast. She currently feels well and is asymptomatic.  She continues to have right arm lymphedema.  She has no neurologic complaints. She denies any recent fevers or illnesses. She has a good appetite and denies weight loss. She has no chest pain or shortness of breath. She denies any nausea, vomiting, constipation, or diarrhea. She has no urinary complaints.  Patient offers no further specific complaints today.  REVIEW OF SYSTEMS:   Review of Systems  Constitutional: Negative.  Negative for fever, malaise/fatigue and weight loss.  Respiratory: Negative.  Negative for cough and shortness of breath.   Cardiovascular: Negative.  Negative for chest pain and leg swelling.  Gastrointestinal: Negative.  Negative for abdominal pain and constipation.  Genitourinary: Negative.  Negative for dysuria.  Musculoskeletal: Negative.  Negative for myalgias.  Skin: Positive for rash.       Erythema  Neurological: Negative.  Negative for sensory change, focal weakness, weakness and headaches.  Psychiatric/Behavioral: Negative.  The patient is not nervous/anxious.     As per HPI. Otherwise, a complete review of systems is negatve.  PAST MEDICAL HISTORY: Past Medical History:  Diagnosis Date  . Breast cancer ([facility] 2011   right breast metastatic carcinoma   . Depression   .  Hypertension   . Personal history of chemotherapy 2011   right breast ca  . Personal history of radiation therapy 2011   right breast ca  . Psoriasis     PAST SURGICAL HISTORY: Past Surgical History:  Procedure Laterality Date  . BREAST LUMPECTOMY Right 2011   lumpectomy. clear margins with reexision of close margin. Rad tx and chemo  . CHOLECYSTECTOMY     partial    FAMILY HISTORY: Reviewed and unchanged. No reported history of malignancy or chronic disease.     ADVANCED DIRECTIVES:    HEALTH MAINTENANCE: Social History   Tobacco Use  . Smoking status: Never Smoker  . Smokeless tobacco: Never Used  Substance Use Topics  . Alcohol use: Yes    Comment: occasional  . Drug use: No     Allergies  Allergen Reactions  . Cephalosporins Rash  . Tape Itching and Dermatitis  . Cephalexin Rash    Current Outpatient Medications  Medication Sig Dispense Refill  . Acetaminophen 500 MG coapsule Take 2 capsules by mouth every 6 (six) hours as needed.     . bisoprolol-hydrochlorothiazide (ZIAC) 5-6.25 MG per tablet TAKE 1 TABLET BY MOUTH EVERY DAY    . doxycycline (VIBRA-TABS) 100 MG tablet Take 1 tablet (100 mg total) by mouth 2 (two) times daily. 60 tablet 6  . FLUoxetine (PROZAC) 10 MG capsule 10 mg.     . Melatonin 10 MG TABS Take by mouth.    . predniSONE (DELTASONE) 10 MG tablet TAKE 3 TABLETS(30 MG) BY MOUTH DAILY WITH BREAKFAST 270 tablet 1  . predniSONE (DELTASONE) 10 MG  tablet Take 1 tablet (10 mg total) by mouth daily with breakfast. 30 tablet 6   No current facility-administered medications for this visit.     OBJECTIVE: Vitals:   08/31/18 0945  BP: (!) 144/74  Pulse: (!) 54  Temp: (!) 97.3 F (36.3 C)     Body mass index is 48.94 kg/m.    ECOG FS:0 - Asymptomatic  General: Well-developed, well-nourished, no acute distress. Eyes: Pink conjunctiva, anicteric sclera. HEENT: Normocephalic, moist mucous membranes. Breast: Exam deferred today. Lungs: Clear  to auscultation bilaterally. Heart: Regular rate and rhythm. No rubs, murmurs, or gallops. Abdomen: Soft, nontender, nondistended. No organomegaly noted, normoactive bowel sounds. Musculoskeletal: Left arm lymphedema. Neuro: Alert, answering all questions appropriately. Cranial nerves grossly intact. Skin: No rashes or petechiae noted. Psych: Normal affect.  LAB RESULTS:  Lab Results  Component Value Date   NA 143 03/15/2018   K 4.0 03/15/2018   CL 103 03/15/2018   CO2 27 03/15/2018   GLUCOSE 124 (H) 03/15/2018   BUN 27 (H) 03/15/2018   CREATININE 0.63 03/15/2018   CALCIUM 9.9 03/15/2018   GFRNONAA >60 03/15/2018   GFRAA >60 03/15/2018    Lab Results  Component Value Date   WBC 6.2 03/15/2018   NEUTROABS 4.2 03/15/2018   HGB 15.0 03/15/2018   HCT 45.0 03/15/2018   MCV 94.6 03/15/2018   PLT 252 03/15/2018     STUDIES: No results found.  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  PLAN:    1. 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  Patient discontinued letrozole after 4+ years.  Her most recent mammogram on February 19, 2018 was reported as BI-RADS 1.  Repeat in July 2020.  Return to clinic in 6 months for routine evaluation.   2. Osteoporosis: Bone mineral density on November 28, 2013 was reported as T score of -2.6.  Patient no longer takes Fosamax. Patient has been instructed to continue calcium and vitamin D twice daily.  Previously she refused additional bone mineral densities, but patient did agree to readdress bone mineral densities at next clinic visit.  She has expressed understanding of the long-term side effects of osteoporosis with using chronic prednisone.   3.  Anxiety: Continue Prozac 20 mg as well as Xanax 0.25 mg as needed. 4.  Breast erythema: Discontinue letrozole as above.  Patient typically takes 20 mg  prednisone daily.  She recently increased her dose to 30 mg, but is now tapering back down.  She reports when she drops below 20 mg daily she has a significant flare of her symptoms. Patient has been evaluated by radiation oncology, dermatology, and surgery.  Patient was previously given a referral to [facility] dermatology, but declined to go to this visit. She expressed understanding of the risks of chronic steroid use, but wishes to continue treatment.  5.  Lymphedema: Right arm.  Continue follow-up in lymphedema clinic as needed.  Patient expressed understanding and was in agreement with this plan. She also understands that She can call clinic at any time with any questions, concerns, or complaints.   Breast cancer   Staging form: Breast, AJCC 7th Edition     Clinical stage from 01/26/2015: Stage IIA (T1c, N1, M0) - Signed by Lloyd Huger, MD on 01/26/2015   Lloyd Huger, MD   08/31/2018 12:56 PM

## 2018-08-31 ENCOUNTER — Inpatient Hospital Stay: Payer: BLUE CROSS/BLUE SHIELD | Attending: Oncology | Admitting: Oncology

## 2018-08-31 ENCOUNTER — Other Ambulatory Visit: Payer: Self-pay

## 2018-08-31 VITALS — BP 144/74 | HR 54 | Temp 97.3°F | Ht 66.0 in | Wt 303.2 lb

## 2018-08-31 DIAGNOSIS — Z170 Estrogen receptor positive status [ER+]: Secondary | ICD-10-CM | POA: Insufficient documentation

## 2018-08-31 DIAGNOSIS — I10 Essential (primary) hypertension: Secondary | ICD-10-CM | POA: Insufficient documentation

## 2018-08-31 DIAGNOSIS — C50411 Malignant neoplasm of upper-outer quadrant of right female breast: Secondary | ICD-10-CM | POA: Diagnosis present

## 2018-08-31 DIAGNOSIS — F419 Anxiety disorder, unspecified: Secondary | ICD-10-CM | POA: Insufficient documentation

## 2018-08-31 DIAGNOSIS — Z79899 Other long term (current) drug therapy: Secondary | ICD-10-CM | POA: Diagnosis not present

## 2018-08-31 DIAGNOSIS — I890 Lymphedema, not elsewhere classified: Secondary | ICD-10-CM | POA: Insufficient documentation

## 2018-08-31 DIAGNOSIS — M818 Other osteoporosis without current pathological fracture: Secondary | ICD-10-CM | POA: Diagnosis not present

## 2018-08-31 MED ORDER — DOXYCYCLINE HYCLATE 100 MG PO TABS: 100.0000 mg | ORAL_TABLET | Freq: Two times a day (BID) | ORAL | 6 refills | Status: DC

## 2018-08-31 MED ORDER — PREDNISONE 10 MG PO TABS: 10.0000 mg | ORAL_TABLET | Freq: Every day | ORAL | 6 refills | Status: DC

## 2018-08-31 NOTE — Progress Notes (Signed)
Patient needs a refill on her dizicycline and prednisone. Patient stated that she is doing well. Patient is needing a refill on her the following medications because she will be going out of town.

## 2018-09-21 ENCOUNTER — Other Ambulatory Visit: Payer: Self-pay | Admitting: Oncology

## 2019-02-21 ENCOUNTER — Ambulatory Visit: Payer: BLUE CROSS/BLUE SHIELD

## 2019-03-01 ENCOUNTER — Ambulatory Visit: Payer: BLUE CROSS/BLUE SHIELD | Admitting: Oncology

## 2019-03-07 ENCOUNTER — Ambulatory Visit: Payer: BLUE CROSS/BLUE SHIELD

## 2019-03-13 ENCOUNTER — Ambulatory Visit: Payer: BLUE CROSS/BLUE SHIELD | Admitting: Oncology

## 2019-03-21 ENCOUNTER — Ambulatory Visit
Admission: RE | Admit: 2019-03-21 | Discharge: 2019-03-21 | Disposition: A | Discharge status: Home / Self Care | Payer: BC Managed Care – PPO | Source: Ambulatory Visit | Attending: Oncology | Admitting: Oncology

## 2019-03-21 ENCOUNTER — Other Ambulatory Visit: Payer: Self-pay

## 2019-03-21 ENCOUNTER — Other Ambulatory Visit: Payer: Self-pay | Admitting: *Deleted

## 2019-03-21 DIAGNOSIS — Z853 Personal history of malignant neoplasm of breast: Secondary | ICD-10-CM | POA: Insufficient documentation

## 2019-03-21 DIAGNOSIS — C50411 Malignant neoplasm of upper-outer quadrant of right female breast: Secondary | ICD-10-CM

## 2019-03-21 DIAGNOSIS — Z1231 Encounter for screening mammogram for malignant neoplasm of breast: Secondary | ICD-10-CM | POA: Diagnosis not present

## 2019-03-21 DIAGNOSIS — Z170 Estrogen receptor positive status [ER+]: Secondary | ICD-10-CM

## 2019-03-21 MED ORDER — PREDNISONE 10 MG PO TABS: ORAL_TABLET | ORAL | 1 refills | Status: DC

## 2019-03-23 NOTE — Progress Notes (Signed)
[place]  [phone] Fax:(336) [phone]  ID: Tiffany Wilson OB: 1956-04-05  MR#: [id number]  OYD#:[id number]  Patient Care Team: Sofie Hartigan, MD as PCP - General (Family Medicine)  I connected with Beola Cord Hession on 03/23/19 at  9:45 AM EDT by telephone visit and verified that I am speaking with the correct person using two identifiers.   I discussed the limitations, risks, security and privacy concerns of performing an evaluation and management service by telemedicine and the availability of in-person appointments. I also discussed with the patient that there may be a patient responsible charge related to this service. The patient expressed understanding and agreed to proceed.   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  INTERVAL HISTORY: Patient agreed to routine follow-up via telephone.  She continues to require daily steroid use for her rash of unknown etiology.  Patient continues to use 30 mg and states when she tapers down even to 25 the rash flares.  She otherwise feels well and is asymptomatic. She continues to have right arm lymphedema.  She has no neurologic complaints. She denies any recent fevers or illnesses. She has a good appetite and denies weight loss.  She denies any chest pain, shortness of breath, cough, or hemoptysis.  She denies any nausea, vomiting, constipation, or diarrhea. She has no urinary complaints.  Patient offers no further specific complaints today.  REVIEW OF SYSTEMS:   Review of Systems  Constitutional: Negative.  Negative for fever, malaise/fatigue and weight loss.  Respiratory: Negative.  Negative for cough and shortness of breath.   Cardiovascular: Negative.  Negative for chest pain and leg  swelling.  Gastrointestinal: Negative.  Negative for abdominal pain and constipation.  Genitourinary: Negative.  Negative for dysuria.  Musculoskeletal: Negative.  Negative for myalgias.  Skin: Positive for rash.       Erythema  Neurological: Negative.  Negative for sensory change, focal weakness, weakness and headaches.  Psychiatric/Behavioral: Negative.  The patient is not nervous/anxious.     As per HPI. Otherwise, a complete review of systems is negatve.  PAST MEDICAL HISTORY: Past Medical History:  Diagnosis Date  . Breast cancer [facility]) 2011   right breast metastatic carcinoma   . Depression   . Hypertension   . Personal history of chemotherapy 2011   right breast ca  . Personal history of radiation therapy 2011   right breast ca  . Psoriasis     PAST SURGICAL HISTORY: Past Surgical History:  Procedure Laterality Date  . BREAST LUMPECTOMY Right 2011   lumpectomy. clear margins with reexision of close margin. Rad tx and chemo  . CHOLECYSTECTOMY     partial    FAMILY HISTORY: Reviewed and unchanged. No reported history of malignancy or chronic disease.     ADVANCED DIRECTIVES:    HEALTH MAINTENANCE: Social History   Tobacco Use  . Smoking status: Never Smoker  . Smokeless tobacco: Never Used  Substance Use Topics  . Alcohol use: Yes    Comment: occasional  . Drug use: No     Allergies  Allergen Reactions  . Cephalosporins Rash  . Tape Itching and Dermatitis  . Cephalexin Rash    Current Outpatient Medications  Medication Sig Dispense Refill  . Acetaminophen 500 MG coapsule Take 2 capsules by mouth every 6 (  six) hours as needed.     . bisoprolol-hydrochlorothiazide (ZIAC) 5-6.25 MG per tablet TAKE 1 TABLET BY MOUTH EVERY DAY    . doxycycline (VIBRA-TABS) 100 MG tablet Take 1 tablet (100 mg total) by mouth 2 (two) times daily. 60 tablet 6  . FLUoxetine (PROZAC) 10 MG capsule 10 mg.     . Melatonin 10 MG TABS Take by mouth.    . predniSONE  (DELTASONE) 10 MG tablet Take as directed 270 tablet 1   No current facility-administered medications for this visit.     OBJECTIVE: There were no vitals filed for this visit.   There is no height or weight on file to calculate BMI.    ECOG FS:0 - Asymptomatic  LAB RESULTS:  Lab Results  Component Value Date   NA 143 03/15/2018   K 4.0 03/15/2018   CL 103 03/15/2018   CO2 27 03/15/2018   GLUCOSE 124 (H) 03/15/2018   BUN 27 (H) 03/15/2018   CREATININE 0.63 03/15/2018   CALCIUM 9.9 03/15/2018   GFRNONAA >60 03/15/2018   GFRAA >60 03/15/2018    Lab Results  Component Value Date   WBC 6.2 03/15/2018   NEUTROABS 4.2 03/15/2018   HGB 15.0 03/15/2018   HCT 45.0 03/15/2018   MCV 94.6 03/15/2018   PLT 252 03/15/2018     STUDIES: Mm 3d Screen Breast Bilateral  Result Date: 03/21/2019 CLINICAL DATA:  Screening. History of RIGHT breast cancer in 2011 status post lumpectomy. EXAM: DIGITAL SCREENING BILATERAL MAMMOGRAM WITH TOMO AND CAD COMPARISON:  Previous exam(s). ACR Breast Density Category b: There are scattered areas of fibroglandular density. FINDINGS: There are stable postsurgical changes within the RIGHT breast. There are no findings suspicious for malignancy within either breast. Images were processed with CAD. IMPRESSION: No mammographic evidence of malignancy. A result letter of this screening mammogram will be mailed directly to the patient. RECOMMENDATION: Screening mammogram in one year. (Code:SM-B-01Y) BI-RADS CATEGORY  2: Benign. Electronically Signed   By: Franki Cabot M.D.   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  PLAN:    1. 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  Patient underwent lumpectomy and sentinel node biopsy on July 15, 2010.  She received adjuvant chemotherapy and XRT.   She was then given letrozole, but discontinued treatment after approximately 4 years. Her most recent mammogram on March 21, 2019 was reported as BI-RADS 2.  Repeat in August 2021.  Return to clinic in 6 months for routine evaluation.  2. Osteoporosis: Bone mineral density on November 28, 2013 was reported as T score of -2.6.  Patient refuses to take further Fosamax.  She states she continues calcium and vitamin D twice daily.  Patient continues to refuse a bone mineral density, but did agree to further discuss at her next clinic visit.  She has expressed understanding of the long-term side effects of osteoporosis with using chronic prednisone.   3.  Anxiety: Continue Prozac 20 mg as well as Xanax 0.25 mg as needed. 4.  Breast erythema/rash: Discontinue letrozole as above.  Patient now takes 30 mg daily and states whenever she tapers down even to 25 mg the rash and erythema become worse.  She has been evaluated by 2 different radiation oncologist, 2 different dermatologist, rheumatologist, and surgery.  She reports no follow-up with any further doctors.  5.  Lymphedema: Right arm.  Continue follow-up in lymphedema clinic as needed.  I provided 15 minutes of non face-to-face telephone visit time during this encounter, and > 50% was spent counseling as documented under my assessment & plan.   Patient expressed understanding and was in agreement with this plan. She also understands that She can call clinic at any time with any questions, concerns, or complaints.   Breast cancer   Staging form: Breast, AJCC 7th Edition     Clinical stage from 01/26/2015: Stage IIA (T1c, N1, M0) - Signed by Lloyd Huger, MD on 01/26/2015   Lloyd Huger, MD   03/23/2019 10:06 AM

## 2019-03-25 ENCOUNTER — Encounter: Payer: Self-pay | Admitting: Oncology

## 2019-03-25 ENCOUNTER — Inpatient Hospital Stay: Payer: BC Managed Care – PPO | Attending: Oncology | Admitting: Oncology

## 2019-03-25 DIAGNOSIS — Z923 Personal history of irradiation: Secondary | ICD-10-CM

## 2019-03-25 DIAGNOSIS — Z170 Estrogen receptor positive status [ER+]: Secondary | ICD-10-CM

## 2019-03-25 DIAGNOSIS — Z9221 Personal history of antineoplastic chemotherapy: Secondary | ICD-10-CM

## 2019-03-25 DIAGNOSIS — C50411 Malignant neoplasm of upper-outer quadrant of right female breast: Secondary | ICD-10-CM

## 2019-03-25 NOTE — Progress Notes (Signed)
Patient wants to go over her mammogram (03/21/2019) results. Patient stated that she continues to have cellulitis on her right breast.

## 2019-09-07 ENCOUNTER — Other Ambulatory Visit: Payer: Self-pay | Admitting: Oncology

## 2019-09-08 ENCOUNTER — Other Ambulatory Visit: Payer: Self-pay | Admitting: Oncology

## 2019-09-20 NOTE — Progress Notes (Deleted)
[place]  Telephone:(336) [phone] Fax:(336) [phone]  ID: Tiffany Wilson OB: Jul 07, 1956  MR#: [id number]  MKL#:[id number]  Patient Care Team: Sofie Hartigan, MD as PCP - General (Family Medicine)  I connected with Beola Cord Karl on 09/20/19 at 10:30 AM EST by telephone visit and verified that I am speaking with the correct person using two identifiers.   I discussed the limitations, risks, security and privacy concerns of performing an evaluation and management service by telemedicine and the availability of in-person appointments. I also discussed with the patient that there may be a patient responsible charge related to this service. The patient expressed understanding and agreed to proceed.   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  INTERVAL HISTORY: Patient agreed to routine follow-up via telephone.  She continues to require daily steroid use for her rash of unknown etiology.  Patient continues to use 30 mg and states when she tapers down even to 25 the rash flares.  She otherwise feels well and is asymptomatic. She continues to have right arm lymphedema.  She has no neurologic complaints. She denies any recent fevers or illnesses. She has a good appetite and denies weight loss.  She denies any chest pain, shortness of breath, cough, or hemoptysis.  She denies any nausea, vomiting, constipation, or diarrhea. She has no urinary complaints.  Patient offers no further specific complaints today.  REVIEW OF SYSTEMS:   Review of Systems  Constitutional: Negative.  Negative for fever, malaise/fatigue and weight loss.  Respiratory: Negative.  Negative for cough and shortness of breath.   Cardiovascular: Negative.  Negative for chest pain and leg  swelling.  Gastrointestinal: Negative.  Negative for abdominal pain and constipation.  Genitourinary: Negative.  Negative for dysuria.  Musculoskeletal: Negative.  Negative for myalgias.  Skin: Positive for rash.       Erythema  Neurological: Negative.  Negative for sensory change, focal weakness, weakness and headaches.  Psychiatric/Behavioral: Negative.  The patient is not nervous/anxious.     As per HPI. Otherwise, a complete review of systems is negatve.  PAST MEDICAL HISTORY: Past Medical History:  Diagnosis Date  . Breast cancer [facility]) 2011   right breast metastatic carcinoma   . Depression   . Hypertension   . Personal history of chemotherapy 2011   right breast ca  . Personal history of radiation therapy 2011   right breast ca  . Psoriasis     PAST SURGICAL HISTORY: Past Surgical History:  Procedure Laterality Date  . BREAST LUMPECTOMY Right 2011   lumpectomy. clear margins with reexision of close margin. Rad tx and chemo  . CHOLECYSTECTOMY     partial    FAMILY HISTORY: Reviewed and unchanged. No reported history of malignancy or chronic disease.     ADVANCED DIRECTIVES:    HEALTH MAINTENANCE: Social History   Tobacco Use  . Smoking status: Never Smoker  . Smokeless tobacco: Never Used  Substance Use Topics  . Alcohol use: Yes    Comment: occasional  . Drug use: No     Allergies  Allergen Reactions  . Cephalosporins Rash  . Tape Itching and Dermatitis  . Cephalexin Rash    Current Outpatient Medications  Medication Sig Dispense Refill  . Acetaminophen 500 MG coapsule Take 2 capsules by mouth every 6 (six)  hours as needed.     Marland Kitchen aspirin EC 81 MG tablet Take 81 mg by mouth daily.    . bisoprolol-hydrochlorothiazide (ZIAC) 5-6.25 MG per tablet TAKE 1 TABLET BY MOUTH EVERY DAY    . doxycycline (VIBRA-TABS) 100 MG tablet TAKE 1 TABLET(100 MG) BY MOUTH TWICE DAILY 60 tablet 6  . FLUoxetine (PROZAC) 10 MG capsule 10 mg.     . Melatonin 10 MG TABS  Take by mouth.    . predniSONE (DELTASONE) 10 MG tablet TAKE 3 TABLETS DAILY WITH BREAKFAST 270 tablet 1   No current facility-administered medications for this visit.    OBJECTIVE: There were no vitals filed for this visit.   There is no height or weight on file to calculate BMI.    ECOG FS:0 - Asymptomatic  LAB RESULTS:  Lab Results  Component Value Date   NA 143 03/15/2018   K 4.0 03/15/2018   CL 103 03/15/2018   CO2 27 03/15/2018   GLUCOSE 124 (H) 03/15/2018   BUN 27 (H) 03/15/2018   CREATININE 0.63 03/15/2018   CALCIUM 9.9 03/15/2018   GFRNONAA >60 03/15/2018   GFRAA >60 03/15/2018    Lab Results  Component Value Date   WBC 6.2 03/15/2018   NEUTROABS 4.2 03/15/2018   HGB 15.0 03/15/2018   HCT 45.0 03/15/2018   MCV 94.6 03/15/2018   PLT 252 03/15/2018     STUDIES: No results found.  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  PLAN:    1. 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  Patient underwent lumpectomy and sentinel node biopsy on July 15, 2010.  She received adjuvant chemotherapy and XRT.  She was then given letrozole, but discontinued treatment after approximately 4 years. Her most recent mammogram on March 21, 2019 was reported as BI-RADS 2.  Repeat in August 2021.  Return to clinic in 6 months for routine evaluation.  2. Osteoporosis: Bone mineral density on November 28, 2013 was reported as T score of -2.6.  Patient refuses to take further Fosamax.  She states she continues calcium and vitamin D twice daily.  Patient continues to refuse a bone mineral density, but did agree to further discuss at her next clinic visit.  She has expressed understanding of the long-term side effects of osteoporosis with using chronic prednisone.   3.  Anxiety: Continue Prozac 20 mg as well as Xanax 0.25 mg as needed. 4.   Breast erythema/rash: Discontinue letrozole as above.  Patient now takes 30 mg daily and states whenever she tapers down even to 25 mg the rash and erythema become worse.  She has been evaluated by 2 different radiation oncologist, 2 different dermatologist, rheumatologist, and surgery.  She reports no follow-up with any further doctors.   5.  Lymphedema: Right arm.  Continue follow-up in lymphedema clinic as needed.  I provided 15 minutes of non face-to-face telephone visit time during this encounter, and > 50% was spent counseling as documented under my assessment & plan.   Patient expressed understanding and was in agreement with this plan. She also understands that She can call clinic at any time with any questions, concerns, or complaints.   Breast cancer   Staging form: Breast, AJCC 7th Edition     Clinical stage from 01/26/2015: Stage IIA (T1c, N1, M0) - Signed by Lloyd Huger, MD on 01/26/2015  Lloyd Huger, MD   09/20/2019 6:40 AM

## 2019-09-26 ENCOUNTER — Inpatient Hospital Stay: Payer: BC Managed Care – PPO | Admitting: Oncology

## 2019-10-12 NOTE — Progress Notes (Deleted)
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  INTERVAL HISTORY: Patient agreed to routine follow-up via telephone.  She continues to require daily steroid use for her rash of unknown etiology.  Patient continues to use 30 mg and states when she tapers down even to 25 the rash flares.  She otherwise feels well and is asymptomatic. She continues to have right arm lymphedema.  She has no neurologic complaints. She denies any recent fevers or illnesses. She has a good appetite and denies weight loss.  She denies any chest pain, shortness of breath, cough, or hemoptysis.  She denies any nausea, vomiting, constipation, or diarrhea. She has no urinary complaints.  Patient offers no further specific complaints today.  REVIEW OF SYSTEMS:   Review of Systems  Constitutional: Negative.  Negative for fever, malaise/fatigue and weight loss.  Respiratory: Negative.  Negative for cough and shortness of breath.   Cardiovascular: Negative.  Negative for chest pain and leg swelling.  Gastrointestinal: Negative.  Negative for abdominal pain and constipation.  Genitourinary: Negative.  Negative for dysuria.  Musculoskeletal: Negative.  Negative for myalgias.  Skin: Positive for rash.       Erythema  Neurological: Negative.  Negative for sensory change, focal weakness, weakness and headaches.  Psychiatric/Behavioral: Negative.  The patient is not nervous/anxious.     As per HPI. Otherwise, a complete review of systems is negatve.  PAST MEDICAL HISTORY: Past Medical History:  Diagnosis Date  . Breast cancer [facility]) 2011   right breast metastatic carcinoma   . Depression   . Hypertension   . Personal history of  chemotherapy 2011   right breast ca  . Personal history of radiation therapy 2011   right breast ca  . Psoriasis     PAST SURGICAL HISTORY: Past Surgical History:  Procedure Laterality Date  . BREAST LUMPECTOMY Right 2011   lumpectomy. clear margins with reexision of close margin. Rad tx and chemo  . CHOLECYSTECTOMY     partial    FAMILY HISTORY: Reviewed and unchanged. No reported history of malignancy or chronic disease.     ADVANCED DIRECTIVES:    HEALTH MAINTENANCE: Social History   Tobacco Use  . Smoking status: Never Smoker  . Smokeless tobacco: Never Used  Substance Use Topics  . Alcohol use: Yes    Comment: occasional  . Drug use: No     Allergies  Allergen Reactions  . Cephalosporins Rash  . Tape Itching and Dermatitis  . Cephalexin Rash    Current Outpatient Medications  Medication Sig Dispense Refill  . Acetaminophen 500 MG coapsule Take 2 capsules by mouth every 6 (six) hours as needed.     Marland Kitchen aspirin EC 81 MG tablet Take 81 mg by mouth daily.    . bisoprolol-hydrochlorothiazide (ZIAC) 5-6.25 MG per tablet TAKE 1 TABLET BY MOUTH EVERY DAY    . doxycycline (VIBRA-TABS) 100 MG tablet TAKE 1 TABLET(100 MG) BY MOUTH TWICE DAILY 60 tablet 6  . FLUoxetine (PROZAC) 10 MG capsule 10 mg.     . Melatonin 10 MG TABS Take by mouth.    . predniSONE (DELTASONE) 10 MG tablet TAKE 3 TABLETS DAILY WITH BREAKFAST 270 tablet 1   No current facility-administered medications for this  visit.    OBJECTIVE: There were no vitals filed for this visit.   There is no height or weight on file to calculate BMI.    ECOG FS:0 - Asymptomatic  LAB RESULTS:  Lab Results  Component Value Date   NA 143 03/15/2018   K 4.0 03/15/2018   CL 103 03/15/2018   CO2 27 03/15/2018   GLUCOSE 124 (H) 03/15/2018   BUN 27 (H) 03/15/2018   CREATININE 0.63 03/15/2018   CALCIUM 9.9 03/15/2018   GFRNONAA >60 03/15/2018   GFRAA >60 03/15/2018    Lab Results  Component Value Date   WBC  6.2 03/15/2018   NEUTROABS 4.2 03/15/2018   HGB 15.0 03/15/2018   HCT 45.0 03/15/2018   MCV 94.6 03/15/2018   PLT 252 03/15/2018     STUDIES: No results found.  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  PLAN:    1. 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  Patient underwent lumpectomy and sentinel node biopsy on July 15, 2010.  She received adjuvant chemotherapy and XRT.  She was then given letrozole, but discontinued treatment after approximately 4 years. Her most recent mammogram on March 21, 2019 was reported as BI-RADS 2.  Repeat in August 2021.  Return to clinic in 6 months for routine evaluation.  2. Osteoporosis: Bone mineral density on November 28, 2013 was reported as T score of -2.6.  Patient refuses to take further Fosamax.  She states she continues calcium and vitamin D twice daily.  Patient continues to refuse a bone mineral density, but did agree to further discuss at her next clinic visit.  She has expressed understanding of the long-term side effects of osteoporosis with using chronic prednisone.   3.  Anxiety: Continue Prozac 20 mg as well as Xanax 0.25 mg as needed. 4.  Breast erythema/rash: Discontinue letrozole as above.  Patient now takes 30 mg daily and states whenever she tapers down even to 25 mg the rash and erythema become worse.  She has been evaluated by 2 different radiation oncologist, 2 different dermatologist, rheumatologist, and surgery.  She reports no follow-up with any further doctors.   5.  Lymphedema: Right arm.  Continue follow-up in lymphedema clinic as needed.   Patient expressed understanding and was in agreement with this plan. She also understands that She can call clinic at any time with any questions, concerns, or complaints.   Breast cancer   Staging form: Breast, AJCC 7th Edition      Clinical stage from 01/26/2015: Stage IIA (T1c, N1, M0) - Signed by Lloyd Huger, MD on 01/26/2015   Lloyd Huger, MD   10/12/2019 9:10 AM

## 2019-10-15 ENCOUNTER — Inpatient Hospital Stay: Payer: BC Managed Care – PPO | Admitting: Oncology

## 2019-10-18 ENCOUNTER — Other Ambulatory Visit: Payer: Self-pay

## 2019-10-18 ENCOUNTER — Encounter: Payer: Self-pay | Admitting: Oncology

## 2019-10-18 NOTE — Progress Notes (Signed)
Patient prescreened for appointment. Patient has no concerns or questions.  

## 2019-10-20 NOTE — Progress Notes (Signed)
  [place]  Telephone:(336) [phone] Fax:(336) [phone]  ID: Virgil Benedict OB: July 17, 1956  MR#: [id number]  [id number]  Patient Care Team: Sofie Hartigan, MD as PCP - General (Family Medicine)     Lloyd Huger, MD   10/20/2019 5:53 PM      This encounter was created in error - please disregard.

## 2019-10-21 ENCOUNTER — Inpatient Hospital Stay: Payer: BC Managed Care – PPO | Admitting: Oncology

## 2019-10-25 NOTE — Progress Notes (Deleted)
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  INTERVAL HISTORY: Patient agreed to routine follow-up via telephone.  She continues to require daily steroid use for her rash of unknown etiology.  Patient continues to use 30 mg and states when she tapers down even to 25 the rash flares.  She otherwise feels well and is asymptomatic. She continues to have right arm lymphedema.  She has no neurologic complaints. She denies any recent fevers or illnesses. She has a good appetite and denies weight loss.  She denies any chest pain, shortness of breath, cough, or hemoptysis.  She denies any nausea, vomiting, constipation, or diarrhea. She has no urinary complaints.  Patient offers no further specific complaints today.  REVIEW OF SYSTEMS:   Review of Systems  Constitutional: Negative.  Negative for fever, malaise/fatigue and weight loss.  Respiratory: Negative.  Negative for cough and shortness of breath.   Cardiovascular: Negative.  Negative for chest pain and leg swelling.  Gastrointestinal: Negative.  Negative for abdominal pain and constipation.  Genitourinary: Negative.  Negative for dysuria.  Musculoskeletal: Negative.  Negative for myalgias.  Skin: Positive for rash.       Erythema  Neurological: Negative.  Negative for sensory change, focal weakness, weakness and headaches.  Psychiatric/Behavioral: Negative.  The patient is not nervous/anxious.     As per HPI. Otherwise, a complete review of systems is negatve.  PAST MEDICAL HISTORY: Past Medical History:  Diagnosis Date  . Breast cancer [facility]) 2011   right breast metastatic carcinoma   . Depression   . Hypertension   . Personal history of  chemotherapy 2011   right breast ca  . Personal history of radiation therapy 2011   right breast ca  . Psoriasis     PAST SURGICAL HISTORY: Past Surgical History:  Procedure Laterality Date  . BREAST LUMPECTOMY Right 2011   lumpectomy. clear margins with reexision of close margin. Rad tx and chemo  . CHOLECYSTECTOMY     partial    FAMILY HISTORY: Reviewed and unchanged. No reported history of malignancy or chronic disease.     ADVANCED DIRECTIVES:    HEALTH MAINTENANCE: Social History   Tobacco Use  . Smoking status: Never Smoker  . Smokeless tobacco: Never Used  Substance Use Topics  . Alcohol use: Yes    Comment: occasional  . Drug use: No     Allergies  Allergen Reactions  . Cephalosporins Rash  . Tape Itching and Dermatitis  . Cephalexin Rash    Current Outpatient Medications  Medication Sig Dispense Refill  . Acetaminophen 500 MG coapsule Take 2 capsules by mouth every 6 (six) hours as needed.     . bisoprolol-hydrochlorothiazide (ZIAC) 5-6.25 MG per tablet TAKE 1 TABLET BY MOUTH EVERY DAY    . doxycycline (VIBRA-TABS) 100 MG tablet TAKE 1 TABLET(100 MG) BY MOUTH TWICE DAILY 60 tablet 6  . FLUoxetine (PROZAC) 10 MG capsule 10 mg.     . Melatonin 10 MG TABS Take by mouth.    . predniSONE (DELTASONE) 10 MG tablet TAKE 3 TABLETS DAILY WITH BREAKFAST 270 tablet 1   No current facility-administered medications for this visit.    OBJECTIVE: There were no vitals filed for this visit.  There is no height or weight on file to calculate BMI.    ECOG FS:0 - Asymptomatic  LAB RESULTS:  Lab Results  Component Value Date   NA 143 03/15/2018   K 4.0 03/15/2018   CL 103 03/15/2018   CO2 27 03/15/2018   GLUCOSE 124 (H) 03/15/2018   BUN 27 (H) 03/15/2018   CREATININE 0.63 03/15/2018   CALCIUM 9.9 03/15/2018   GFRNONAA >60 03/15/2018   GFRAA >60 03/15/2018    Lab Results  Component Value Date   WBC 6.2 03/15/2018   NEUTROABS 4.2 03/15/2018   HGB 15.0  03/15/2018   HCT 45.0 03/15/2018   MCV 94.6 03/15/2018   PLT 252 03/15/2018     STUDIES: No results found.  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  PLAN:    1. 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  Patient underwent lumpectomy and sentinel node biopsy on July 15, 2010.  She received adjuvant chemotherapy and XRT.  She was then given letrozole, but discontinued treatment after approximately 4 years. Her most recent mammogram on March 21, 2019 was reported as BI-RADS 2.  Repeat in August 2021.  Return to clinic in 6 months for routine evaluation.  2. Osteoporosis: Bone mineral density on November 28, 2013 was reported as T score of -2.6.  Patient refuses to take further Fosamax.  She states she continues calcium and vitamin D twice daily.  Patient continues to refuse a bone mineral density, but did agree to further discuss at her next clinic visit.  She has expressed understanding of the long-term side effects of osteoporosis with using chronic prednisone.   3.  Anxiety: Continue Prozac 20 mg as well as Xanax 0.25 mg as needed. 4.  Breast erythema/rash: Discontinue letrozole as above.  Patient now takes 30 mg daily and states whenever she tapers down even to 25 mg the rash and erythema become worse.  She has been evaluated by 2 different radiation oncologist, 2 different dermatologist, rheumatologist, and surgery.  She reports no follow-up with any further doctors.   5.  Lymphedema: Right arm.  Continue follow-up in lymphedema clinic as needed.   Patient expressed understanding and was in agreement with this plan. She also understands that She can call clinic at any time with any questions, concerns, or complaints.   Breast cancer   Staging form: Breast, AJCC 7th Edition     Clinical stage from 01/26/2015: Stage IIA (T1c, N1, M0) -  Signed by Lloyd Huger, MD on 01/26/2015   Lloyd Huger, MD   10/25/2019 12:09 PM

## 2019-11-01 ENCOUNTER — Inpatient Hospital Stay: Payer: BC Managed Care – PPO | Admitting: Oncology

## 2019-11-01 NOTE — Progress Notes (Signed)
[place]  [phone]) [phone] [phone]  ID: Tiffany Wilson OB: Dec 29, 1955  MR#: [id number]  YOK#:[id number]  Patient Care Team: Sofie Hartigan, MD as PCP - General (Family Medicine)  I connected with Tiffany Wilson on 11/05/19 at  2:30 PM EDT by video enabled telemedicine visit and verified that I am speaking with the correct person using two identifiers.   I discussed the limitations, risks, security and privacy concerns of performing an evaluation and management service by telemedicine and the availability of in-person appointments. I also discussed with the patient that there may be a patient responsible charge related to this service. The patient expressed understanding and agreed to proceed.   Other persons participating in the visit and their role in the encounter: Patient, MD.  Patient's location: Home. Provider's location: Clinic.  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  INTERVAL HISTORY: Patient agreed to video enabled telemedicine visit for her routine 61-monthevaluation.  She continues to require 30 mg prednisone daily to keep the rash on her breast and arm tolerable.  Recently, she upped her dose to 40 mg but now is tapering back down.  She otherwise feels well.  She continues to have right arm lymphedema.  She has no neurologic complaints. She denies any recent fevers or illnesses. She has a good appetite and denies weight loss.  She denies any chest pain, shortness of breath, cough, or hemoptysis.  She denies any nausea, vomiting, constipation, or diarrhea. She has no urinary complaints.  Patient offers no further specific complaints today.  REVIEW OF SYSTEMS:   Review of Systems  Constitutional: Negative.  Negative for fever, malaise/fatigue and weight loss.  Respiratory: Negative.  Negative for cough and shortness of breath.   Cardiovascular: Negative.   Negative for chest pain and leg swelling.  Gastrointestinal: Negative.  Negative for abdominal pain and constipation.  Genitourinary: Negative.  Negative for dysuria.  Musculoskeletal: Negative.  Negative for myalgias.  Skin: Positive for rash.       Erythema  Neurological: Negative.  Negative for sensory change, focal weakness, weakness and headaches.  Psychiatric/Behavioral: Negative.  The patient is not nervous/anxious.     As per HPI. Otherwise, a complete review of systems is negatve.  PAST MEDICAL HISTORY: Past Medical History:  Diagnosis Date  . Breast cancer ([facility] 2011   right breast metastatic carcinoma   . Depression   . Hypertension   . Personal history of chemotherapy 2011   right breast ca  . Personal history of radiation therapy 2011   right breast ca  . Psoriasis     PAST SURGICAL HISTORY: Past Surgical History:  Procedure Laterality Date  . BREAST LUMPECTOMY Right 2011   lumpectomy. clear margins with reexision of close margin. Rad tx and chemo  . CHOLECYSTECTOMY     partial    FAMILY HISTORY: Reviewed and unchanged. No reported history of malignancy or chronic disease.     ADVANCED DIRECTIVES:    HEALTH MAINTENANCE: Social History   Tobacco Use  . Smoking status: Never Smoker  . Smokeless tobacco: Never Used  Substance Use Topics  . Alcohol use: Yes    Comment: occasional  . Drug use: No     Allergies  Allergen Reactions  . Cephalosporins Rash  . Tape Itching and Dermatitis  . Cephalexin Rash    Current Outpatient Medications  Medication Sig Dispense Refill  . Acetaminophen 500 MG coapsule Take  2 capsules by mouth every 6 (six) hours as needed.     . bisoprolol-hydrochlorothiazide (ZIAC) 5-6.25 MG per tablet TAKE 1 TABLET BY MOUTH EVERY DAY    . FLUoxetine (PROZAC) 10 MG capsule 10 mg.     . Melatonin 10 MG TABS Take by mouth.    . doxycycline (VIBRA-TABS) 100 MG tablet TAKE 1 TABLET(100 MG) BY MOUTH TWICE DAILY 60 tablet 6  .  predniSONE (DELTASONE) 10 MG tablet TAKE 3 TABLETS DAILY WITH BREAKFAST 270 tablet 1   No current facility-administered medications for this visit.    OBJECTIVE: Vitals:     There is no height or weight on file to calculate BMI.    ECOG FS:1 - Symptomatic but completely ambulatory   General: Well-developed, well-nourished, no acute distress. HEENT: Normocephalic. Neuro: Alert, answering all questions appropriately. Cranial nerves grossly intact. Psych: Normal affect.  LAB RESULTS:  Lab Results  Component Value Date   NA 143 03/15/2018   K 4.0 03/15/2018   CL 103 03/15/2018   CO2 27 03/15/2018   GLUCOSE 124 (H) 03/15/2018   BUN 27 (H) 03/15/2018   CREATININE 0.63 03/15/2018   CALCIUM 9.9 03/15/2018   GFRNONAA >60 03/15/2018   GFRAA >60 03/15/2018    Lab Results  Component Value Date   WBC 6.2 03/15/2018   NEUTROABS 4.2 03/15/2018   HGB 15.0 03/15/2018   HCT 45.0 03/15/2018   MCV 94.6 03/15/2018   PLT 252 03/15/2018     STUDIES: No results found.  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  PLAN:    1. 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  Patient underwent lumpectomy and sentinel node biopsy on July 15, 2010.  She received adjuvant chemotherapy and XRT completing in 2012.  She was then given letrozole, but discontinued treatment after approximately 4 years. Her most recent mammogram on March 21, 2019 was reported as BI-RADS 2.  Repeat mammogram in August 2021.  Return to clinic in 6 months for routine evaluation.   2. Osteoporosis: Bone mineral density on November 28, 2013 was reported as T score of -2.6.  Patient continues to refuse Fosamax, although is taking calcium and vitamin D supplementation.  She continues to refuse a repeat bone mineral density. She has expressed understanding of the long-term side  effects of osteoporosis with using chronic prednisone.   3.  Anxiety: Chronic and unchanged.  Continue Prozac 20 mg as well as Xanax 0.25 mg as needed. 4.  Breast erythema/rash: Unclear etiology.  Patient now takes 30 mg daily and states whenever she tapers down even to 25 mg the rash and erythema become worse.  She has been evaluated by 2 different radiation oncologist, 2 different dermatologist, a rheumatologist, and surgery.  She reports no follow-up with any further doctors.  Patient expressed understanding that eventually her primary care physician will have to take over management of her rash. 5.  Lymphedema: Right arm.  Continue follow-up in lymphedema clinic as needed.  I provided 20 minutes of face-to-face video visit time during this encounter which included chart review, counseling, and coordination of care as documented above.   Patient expressed understanding and was in agreement with this plan. She also understands that She can call clinic at any time with any questions, concerns, or complaints.   Breast cancer   Staging form: Breast, AJCC 7th Edition  Clinical stage from 01/26/2015: Stage IIA (T1c, N1, M0) - Signed by Lloyd Huger, MD on 01/26/2015   Lloyd Huger, MD   11/05/2019 6:38 PM

## 2019-11-05 ENCOUNTER — Other Ambulatory Visit: Payer: Self-pay | Admitting: Emergency Medicine

## 2019-11-05 ENCOUNTER — Inpatient Hospital Stay: Payer: BC Managed Care – PPO | Attending: Oncology | Admitting: Oncology

## 2019-11-05 ENCOUNTER — Encounter: Payer: Self-pay | Admitting: Oncology

## 2019-11-05 ENCOUNTER — Other Ambulatory Visit: Payer: Self-pay

## 2019-11-05 DIAGNOSIS — Z923 Personal history of irradiation: Secondary | ICD-10-CM

## 2019-11-05 DIAGNOSIS — F419 Anxiety disorder, unspecified: Secondary | ICD-10-CM

## 2019-11-05 DIAGNOSIS — C50411 Malignant neoplasm of upper-outer quadrant of right female breast: Secondary | ICD-10-CM

## 2019-11-05 DIAGNOSIS — R21 Rash and other nonspecific skin eruption: Secondary | ICD-10-CM | POA: Diagnosis not present

## 2019-11-05 DIAGNOSIS — Z170 Estrogen receptor positive status [ER+]: Secondary | ICD-10-CM

## 2019-11-05 DIAGNOSIS — M810 Age-related osteoporosis without current pathological fracture: Secondary | ICD-10-CM | POA: Diagnosis not present

## 2019-11-05 DIAGNOSIS — I890 Lymphedema, not elsewhere classified: Secondary | ICD-10-CM | POA: Diagnosis not present

## 2019-11-05 DIAGNOSIS — Z79899 Other long term (current) drug therapy: Secondary | ICD-10-CM

## 2019-11-05 DIAGNOSIS — Z9221 Personal history of antineoplastic chemotherapy: Secondary | ICD-10-CM

## 2019-11-05 DIAGNOSIS — Z853 Personal history of malignant neoplasm of breast: Secondary | ICD-10-CM

## 2019-11-05 MED ORDER — DOXYCYCLINE HYCLATE 100 MG PO TABS: ORAL_TABLET | ORAL | 6 refills | Status: DC

## 2019-11-05 MED ORDER — PREDNISONE 10 MG PO TABS: ORAL_TABLET | ORAL | 1 refills | Status: DC

## 2019-11-05 NOTE — Progress Notes (Signed)
Patient prescreened for appointment. Patient has no concerns or questions.  

## 2020-03-26 ENCOUNTER — Other Ambulatory Visit: Payer: Self-pay | Admitting: Oncology

## 2020-03-26 DIAGNOSIS — C50411 Malignant neoplasm of upper-outer quadrant of right female breast: Secondary | ICD-10-CM

## 2020-03-26 DIAGNOSIS — Z170 Estrogen receptor positive status [ER+]: Secondary | ICD-10-CM

## 2020-05-04 ENCOUNTER — Ambulatory Visit
Admission: RE | Admit: 2020-05-04 | Discharge: 2020-05-04 | Disposition: A | Discharge status: Home / Self Care | Payer: BC Managed Care – PPO | Source: Ambulatory Visit | Attending: Oncology | Admitting: Oncology

## 2020-05-04 ENCOUNTER — Other Ambulatory Visit: Payer: Self-pay

## 2020-05-04 DIAGNOSIS — Z1231 Encounter for screening mammogram for malignant neoplasm of breast: Secondary | ICD-10-CM | POA: Insufficient documentation

## 2020-05-04 DIAGNOSIS — Z170 Estrogen receptor positive status [ER+]: Secondary | ICD-10-CM | POA: Diagnosis not present

## 2020-05-04 DIAGNOSIS — C50411 Malignant neoplasm of upper-outer quadrant of right female breast: Secondary | ICD-10-CM | POA: Insufficient documentation

## 2020-05-12 ENCOUNTER — Ambulatory Visit: Payer: BC Managed Care – PPO | Admitting: Oncology

## 2020-05-14 NOTE — Progress Notes (Signed)
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  INTERVAL HISTORY: Patient returns to clinic today for routine 67-monthevaluation.  She continues to require between 20 and 30 mg prednisone daily to keep the rash on her breast and arm tolerable.  She occasionally uses doxycycline as well.  She is currently taking 25 mg.  She otherwise feels well and is asymptomatic. She continues to have right arm lymphedema.  She has no neurologic complaints. She denies any recent fevers or illnesses. She has a good appetite and denies weight loss.  She denies any chest pain, shortness of breath, cough, or hemoptysis.  She denies any nausea, vomiting, constipation, or diarrhea. She has no urinary complaints.  Patient offers no further specific complaints today.  REVIEW OF SYSTEMS:   Review of Systems  Constitutional: Negative.  Negative for fever, malaise/fatigue and weight loss.  Respiratory: Negative.  Negative for cough and shortness of breath.   Cardiovascular: Negative.  Negative for chest pain and leg swelling.  Gastrointestinal: Negative.  Negative for abdominal pain and constipation.  Genitourinary: Negative.  Negative for dysuria.  Musculoskeletal: Negative.  Negative for myalgias.  Skin: Positive for rash.       Erythema  Neurological: Negative.  Negative for sensory change, focal weakness, weakness and headaches.  Psychiatric/Behavioral: Negative.  The patient is not nervous/anxious.     As per HPI. Otherwise, a complete review of systems is negatve.  PAST MEDICAL HISTORY: Past Medical History:  Diagnosis Date  . Breast cancer ([facility] 2011   right breast metastatic carcinoma   . Depression   .  Hypertension   . Personal history of chemotherapy 2011   right breast ca  . Personal history of radiation therapy 2011   right breast ca  . Psoriasis     PAST SURGICAL HISTORY: Past Surgical History:  Procedure Laterality Date  . BREAST LUMPECTOMY Right 2011   lumpectomy. clear margins with reexision of close margin. Rad tx and chemo  . CHOLECYSTECTOMY     partial    FAMILY HISTORY: Reviewed and unchanged. No reported history of malignancy or chronic disease.     ADVANCED DIRECTIVES:    HEALTH MAINTENANCE: Social History   Tobacco Use  . Smoking status: Never Smoker  . Smokeless tobacco: Never Used  Vaping Use  . Vaping Use: Never used  Substance Use Topics  . Alcohol use: Yes    Comment: occasional  . Drug use: No     Allergies  Allergen Reactions  . Cephalosporins Rash  . Tape Itching and Dermatitis  . Cephalexin Rash    Current Outpatient Medications  Medication Sig Dispense Refill  . Acetaminophen 500 MG coapsule Take 2 capsules by mouth every 6 (six) hours as needed.     . bisoprolol-hydrochlorothiazide (ZIAC) 5-6.25 MG per tablet TAKE 1 TABLET BY MOUTH EVERY DAY    . doxycycline (VIBRA-TABS) 100 MG tablet TAKE 1 TABLET(100 MG) BY MOUTH TWICE DAILY 60 tablet 3  . FLUoxetine (PROZAC) 10 MG capsule 10 mg.     . Melatonin 10 MG TABS Take by mouth.    . predniSONE (DELTASONE) 10 MG tablet Take 3 tablets daily with breakfast 270 tablet 3   No current facility-administered medications for this visit.  OBJECTIVE: Vitals:   05/19/20 1055  BP: (!) 147/68  Pulse: (!) 51  Resp: 18  Temp: 97.6 F (36.4 C)  SpO2: 96%     Body mass index is 46.92 kg/m.    ECOG FS:1 - Symptomatic but completely ambulatory   General: Well-developed, well-nourished, no acute distress. Eyes: Pink conjunctiva, anicteric sclera. HEENT: Normocephalic, moist mucous membranes. Breast: Right breast erythema. Lungs: No audible wheezing or coughing. Heart: Regular rate and  rhythm. Abdomen: Soft, nontender, no obvious distention. Musculoskeletal: No edema, cyanosis, or clubbing. Neuro: Alert, answering all questions appropriately. Cranial nerves grossly intact. Skin: No rashes or petechiae noted. Psych: Normal affect.  LAB RESULTS:  Lab Results  Component Value Date   NA 143 03/15/2018   K 4.0 03/15/2018   CL 103 03/15/2018   CO2 27 03/15/2018   GLUCOSE 124 (H) 03/15/2018   BUN 27 (H) 03/15/2018   CREATININE 0.63 03/15/2018   CALCIUM 9.9 03/15/2018   GFRNONAA >60 03/15/2018   GFRAA >60 03/15/2018    Lab Results  Component Value Date   WBC 6.2 03/15/2018   NEUTROABS 4.2 03/15/2018   HGB 15.0 03/15/2018   HCT 45.0 03/15/2018   MCV 94.6 03/15/2018   PLT 252 03/15/2018     STUDIES: MM 3D SCREEN BREAST BILATERAL  Result Date: 05/04/2020 CLINICAL DATA:  Screening. EXAM: DIGITAL SCREENING BILATERAL MAMMOGRAM WITH TOMO AND CAD COMPARISON:  Previous exam(s). ACR Breast Density Category b: There are scattered areas of fibroglandular density. FINDINGS: There are no findings suspicious for malignancy. Images were processed with CAD. IMPRESSION: No mammographic evidence of malignancy. A result letter of this screening mammogram will be mailed directly to the patient. RECOMMENDATION: Screening mammogram in one year. (Code:SM-B-01Y) BI-RADS CATEGORY  1: Negative. Electronically Signed   By: Lillia Mountain M.D.   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  PLAN:    1. 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  Patient underwent lumpectomy and sentinel node biopsy on July 15, 2010.  She received adjuvant chemotherapy and XRT completing in 2012.  She was then given letrozole, but discontinued treatment after approximately 4 years.  Her most recent mammogram on May 04, 2020 was  reported as BI-RADS 2.  Repeat in September 2022.  Return to clinic in 6 months for routine evaluation.  2. Osteoporosis: Bone mineral density on November 28, 2013 was reported as T score of -2.6.  Patient continues to refuse Fosamax, although is taking calcium and vitamin D supplementation.  She continues to refuse a repeat bone mineral density. She has expressed understanding of the long-term side effects of osteoporosis with using chronic prednisone.   3.  Anxiety: Chronic and unchanged.  Continue Prozac 20 mg as well as Xanax 0.25 mg as needed. 4.  Breast erythema/rash: Unclear etiology.  Patient requires between 20 and 30 mg daily.  She has been evaluated by 2 different radiation oncologist, 2 different dermatologist, a rheumatologist, and surgery.  She reports no follow-up with any further doctors, although expressed interest in returning to dermatology and/or rheumatology.  She plans to have primary care help set this up.  Patient once again expressed understanding that eventually her primary care physician will have to take over management of her rash. 5.  Lymphedema: Right arm.  Continue follow-up in lymphedema clinic as needed.    Patient expressed  understanding and was in agreement with this plan. She also understands that She can call clinic at any time with any questions, concerns, or complaints.   Breast cancer   Staging form: Breast, AJCC 7th Edition     Clinical stage from 01/26/2015: Stage IIA (T1c, N1, M0) - Signed by Lloyd Huger, MD on 01/26/2015   Lloyd Huger, MD   05/19/2020 11:57 AM

## 2020-05-18 NOTE — Progress Notes (Signed)
Patient states she is getting ready to change primary care doctors and would like to know if Dr. Grayland Ormond could write a script for prednisone and doxycycline to have on hand. She states she currently does not need but would like to have for future. Patient states she was having difficulty with current pcp refilling these medications for her. Denies other concerns at this time.

## 2020-05-19 ENCOUNTER — Encounter: Payer: Self-pay | Admitting: Oncology

## 2020-05-19 ENCOUNTER — Inpatient Hospital Stay: Payer: BC Managed Care – PPO | Attending: Oncology | Admitting: Oncology

## 2020-05-19 ENCOUNTER — Other Ambulatory Visit: Payer: Self-pay

## 2020-05-19 VITALS — BP 147/68 | HR 51 | Temp 97.6°F | Resp 18 | Ht 66.0 in | Wt 290.7 lb

## 2020-05-19 DIAGNOSIS — R21 Rash and other nonspecific skin eruption: Secondary | ICD-10-CM | POA: Insufficient documentation

## 2020-05-19 DIAGNOSIS — I10 Essential (primary) hypertension: Secondary | ICD-10-CM | POA: Insufficient documentation

## 2020-05-19 DIAGNOSIS — F329 Major depressive disorder, single episode, unspecified: Secondary | ICD-10-CM | POA: Insufficient documentation

## 2020-05-19 DIAGNOSIS — Z9221 Personal history of antineoplastic chemotherapy: Secondary | ICD-10-CM | POA: Insufficient documentation

## 2020-05-19 DIAGNOSIS — F419 Anxiety disorder, unspecified: Secondary | ICD-10-CM | POA: Diagnosis not present

## 2020-05-19 DIAGNOSIS — Z170 Estrogen receptor positive status [ER+]: Secondary | ICD-10-CM | POA: Insufficient documentation

## 2020-05-19 DIAGNOSIS — Z79899 Other long term (current) drug therapy: Secondary | ICD-10-CM | POA: Diagnosis not present

## 2020-05-19 DIAGNOSIS — I890 Lymphedema, not elsewhere classified: Secondary | ICD-10-CM | POA: Diagnosis not present

## 2020-05-19 DIAGNOSIS — C50411 Malignant neoplasm of upper-outer quadrant of right female breast: Secondary | ICD-10-CM | POA: Diagnosis not present

## 2020-05-19 DIAGNOSIS — Z7952 Long term (current) use of systemic steroids: Secondary | ICD-10-CM | POA: Insufficient documentation

## 2020-05-19 DIAGNOSIS — Z79811 Long term (current) use of aromatase inhibitors: Secondary | ICD-10-CM | POA: Diagnosis not present

## 2020-05-19 MED ORDER — PREDNISONE 10 MG PO TABS: ORAL_TABLET | ORAL | 3 refills | Status: AC

## 2020-05-19 MED ORDER — DOXYCYCLINE HYCLATE 100 MG PO TABS: ORAL_TABLET | ORAL | 3 refills | Status: AC

## 2020-11-17 ENCOUNTER — Ambulatory Visit: Payer: BC Managed Care – PPO | Admitting: Oncology

## 2020-11-21 NOTE — Progress Notes (Deleted)
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  INTERVAL HISTORY: Patient returns to clinic today for routine 78-monthevaluation.  She continues to require between 20 and 30 mg prednisone daily to keep the rash on her breast and arm tolerable.  She occasionally uses doxycycline as well.  She is currently taking 25 mg.  She otherwise feels well and is asymptomatic. She continues to have right arm lymphedema.  She has no neurologic complaints. She denies any recent fevers or illnesses. She has a good appetite and denies weight loss.  She denies any chest pain, shortness of breath, cough, or hemoptysis.  She denies any nausea, vomiting, constipation, or diarrhea. She has no urinary complaints.  Patient offers no further specific complaints today.  REVIEW OF SYSTEMS:   Review of Systems  Constitutional: Negative.  Negative for fever, malaise/fatigue and weight loss.  Respiratory: Negative.  Negative for cough and shortness of breath.   Cardiovascular: Negative.  Negative for chest pain and leg swelling.  Gastrointestinal: Negative.  Negative for abdominal pain and constipation.  Genitourinary: Negative.  Negative for dysuria.  Musculoskeletal: Negative.  Negative for myalgias.  Skin: Positive for rash.       Erythema  Neurological: Negative.  Negative for sensory change, focal weakness, weakness and headaches.  Psychiatric/Behavioral: Negative.  The patient is not nervous/anxious.     As per HPI. Otherwise, a complete review of systems is negatve.  PAST MEDICAL HISTORY: Past Medical History:  Diagnosis Date  . Breast cancer ([facility] 2011   right breast metastatic carcinoma   . Depression   .  Hypertension   . Personal history of chemotherapy 2011   right breast ca  . Personal history of radiation therapy 2011   right breast ca  . Psoriasis     PAST SURGICAL HISTORY: Past Surgical History:  Procedure Laterality Date  . BREAST LUMPECTOMY Right 2011   lumpectomy. clear margins with reexision of close margin. Rad tx and chemo  . CHOLECYSTECTOMY     partial    FAMILY HISTORY: Reviewed and unchanged. No reported history of malignancy or chronic disease.     ADVANCED DIRECTIVES:    HEALTH MAINTENANCE: Social History   Tobacco Use  . Smoking status: Never Smoker  . Smokeless tobacco: Never Used  Vaping Use  . Vaping Use: Never used  Substance Use Topics  . Alcohol use: Yes    Comment: occasional  . Drug use: No     Allergies  Allergen Reactions  . Cephalosporins Rash  . Tape Itching and Dermatitis  . Cephalexin Rash    Current Outpatient Medications  Medication Sig Dispense Refill  . Acetaminophen 500 MG coapsule Take 2 capsules by mouth every 6 (six) hours as needed.     . bisoprolol-hydrochlorothiazide (ZIAC) 5-6.25 MG per tablet TAKE 1 TABLET BY MOUTH EVERY DAY    . doxycycline (VIBRA-TABS) 100 MG tablet TAKE 1 TABLET(100 MG) BY MOUTH TWICE DAILY 60 tablet 3  . FLUoxetine (PROZAC) 10 MG capsule 10 mg.     . Melatonin 10 MG TABS Take by mouth.    . predniSONE (DELTASONE) 10 MG tablet Take 3 tablets daily with breakfast 270 tablet 3   No current facility-administered medications for this visit.  OBJECTIVE: There were no vitals filed for this visit.   There is no height or weight on file to calculate BMI.    ECOG FS:1 - Symptomatic but completely ambulatory   General: Well-developed, well-nourished, no acute distress. Eyes: Pink conjunctiva, anicteric sclera. HEENT: Normocephalic, moist mucous membranes. Breast: Right breast erythema. Lungs: No audible wheezing or coughing. Heart: Regular rate and rhythm. Abdomen: Soft, nontender, no obvious  distention. Musculoskeletal: No edema, cyanosis, or clubbing. Neuro: Alert, answering all questions appropriately. Cranial nerves grossly intact. Skin: No rashes or petechiae noted. Psych: Normal affect.  LAB RESULTS:  Lab Results  Component Value Date   NA 143 03/15/2018   K 4.0 03/15/2018   CL 103 03/15/2018   CO2 27 03/15/2018   GLUCOSE 124 (H) 03/15/2018   BUN 27 (H) 03/15/2018   CREATININE 0.63 03/15/2018   CALCIUM 9.9 03/15/2018   GFRNONAA >60 03/15/2018   GFRAA >60 03/15/2018    Lab Results  Component Value Date   WBC 6.2 03/15/2018   NEUTROABS 4.2 03/15/2018   HGB 15.0 03/15/2018   HCT 45.0 03/15/2018   MCV 94.6 03/15/2018   PLT 252 03/15/2018     STUDIES: No results found.  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  PLAN:    1. 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  Patient underwent lumpectomy and sentinel node biopsy on July 15, 2010.  She received adjuvant chemotherapy and XRT completing in 2012.  She was then given letrozole, but discontinued treatment after approximately 4 years.  Her most recent mammogram on May 04, 2020 was reported as BI-RADS 2.  Repeat in September 2022.  Return to clinic in 6 months for routine evaluation.  2. Osteoporosis: Bone mineral density on November 28, 2013 was reported as T score of -2.6.  Patient continues to refuse Fosamax, although is taking calcium and vitamin D supplementation.  She continues to refuse a repeat bone mineral density. She has expressed understanding of the long-term side effects of osteoporosis with using chronic prednisone.   3.  Anxiety: Chronic and unchanged.  Continue Prozac 20 mg as well as Xanax 0.25 mg as needed. 4.  Breast erythema/rash: Unclear etiology.  Patient requires between 20 and 30 mg daily.  She has been evaluated by 2 different  radiation oncologist, 2 different dermatologist, a rheumatologist, and surgery.  She reports no follow-up with any further doctors, although expressed interest in returning to dermatology and/or rheumatology.  She plans to have primary care help set this up.  Patient once again expressed understanding that eventually her primary care physician will have to take over management of her rash. 5.  Lymphedema: Right arm.  Continue follow-up in lymphedema clinic as needed.    Patient expressed understanding and was in agreement with this plan. She also understands that She can call clinic at any time with any questions, concerns, or complaints.   Breast cancer   Staging form: Breast, AJCC 7th Edition     Clinical stage from 01/26/2015: Stage IIA (T1c, N1, M0) - Signed by Lloyd Huger, MD on 01/26/2015   Lloyd Huger, MD   11/21/2020 12:27 PM

## 2020-11-26 ENCOUNTER — Inpatient Hospital Stay: Payer: BC Managed Care – PPO | Admitting: Oncology

## 2020-11-26 DIAGNOSIS — C50411 Malignant neoplasm of upper-outer quadrant of right female breast: Secondary | ICD-10-CM

## 2020-11-29 NOTE — Progress Notes (Signed)
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  INTERVAL HISTORY: Patient returns to clinic today for routine 57-monthevaluation.  She continues to take 20 mg of prednisone daily to keep the rash on her breast and arm tolerable.  This is now managed by primary care.  She otherwise feels well and is asymptomatic. She continues to have right arm lymphedema.  She has no neurologic complaints. She denies any recent fevers or illnesses. She has a good appetite and denies weight loss.  She denies any chest pain, shortness of breath, cough, or hemoptysis.  She denies any nausea, vomiting, constipation, or diarrhea. She has no urinary complaints.  Patient offers no further specific complaints today.  REVIEW OF SYSTEMS:   Review of Systems  Constitutional: Negative.  Negative for fever, malaise/fatigue and weight loss.  Respiratory: Negative.  Negative for cough and shortness of breath.   Cardiovascular: Negative.  Negative for chest pain and leg swelling.  Gastrointestinal: Negative.  Negative for abdominal pain and constipation.  Genitourinary: Negative.  Negative for dysuria.  Musculoskeletal: Negative.  Negative for myalgias.  Skin: Positive for rash.       Erythema  Neurological: Negative.  Negative for sensory change, focal weakness, weakness and headaches.  Psychiatric/Behavioral: Negative.  The patient is not nervous/anxious.     As per HPI. Otherwise, a complete review of systems is negatve.  PAST MEDICAL HISTORY: Past Medical History:  Diagnosis Date  . Breast cancer ([facility] 2011   right breast metastatic carcinoma   . Depression   . Hypertension   . Personal history of chemotherapy 2011    right breast ca  . Personal history of radiation therapy 2011   right breast ca  . Psoriasis     PAST SURGICAL HISTORY: Past Surgical History:  Procedure Laterality Date  . BREAST LUMPECTOMY Right 2011   lumpectomy. clear margins with reexision of close margin. Rad tx and chemo  . CHOLECYSTECTOMY     partial    FAMILY HISTORY: Reviewed and unchanged. No reported history of malignancy or chronic disease.     ADVANCED DIRECTIVES:    HEALTH MAINTENANCE: Social History   Tobacco Use  . Smoking status: Never Smoker  . Smokeless tobacco: Never Used  Vaping Use  . Vaping Use: Never used  Substance Use Topics  . Alcohol use: Yes    Comment: occasional  . Drug use: No     Allergies  Allergen Reactions  . Cephalosporins Rash  . Tape Itching and Dermatitis  . Cephalexin Rash    Current Outpatient Medications  Medication Sig Dispense Refill  . Acetaminophen 500 MG coapsule Take 2 capsules by mouth every 6 (six) hours as needed.     . bisoprolol-hydrochlorothiazide (ZIAC) 5-6.25 MG per tablet TAKE 1 TABLET BY MOUTH EVERY DAY    . doxycycline (VIBRA-TABS) 100 MG tablet TAKE 1 TABLET(100 MG) BY MOUTH TWICE DAILY 60 tablet 3  . FLUoxetine (PROZAC) 10 MG capsule 10 mg.     . Melatonin 10 MG TABS Take by mouth.    . predniSONE (DELTASONE) 10 MG tablet Take 3 tablets daily with breakfast 270 tablet 3   No current facility-administered medications for this visit.    OBJECTIVE: Vitals:   12/03/20  1017  BP: (!) 108/56  Pulse: (!) 53  Temp: (!) 97.4 F (36.3 C)  SpO2: 92%     Body mass index is 46.92 kg/m.    ECOG FS:1 - Symptomatic but completely ambulatory   General: Well-developed, well-nourished, no acute distress. Eyes: Pink conjunctiva, anicteric sclera. HEENT: Normocephalic, moist mucous membranes. Breast: Right breast erythema. Lungs: No audible wheezing or coughing. Heart: Regular rate and rhythm. Abdomen: Soft, nontender, no obvious  distention. Musculoskeletal: No edema, cyanosis, or clubbing. Neuro: Alert, answering all questions appropriately. Cranial nerves grossly intact. Skin: No rashes or petechiae noted. Psych: Normal affect.   LAB RESULTS:  Lab Results  Component Value Date   NA 143 03/15/2018   K 4.0 03/15/2018   CL 103 03/15/2018   CO2 27 03/15/2018   GLUCOSE 124 (H) 03/15/2018   BUN 27 (H) 03/15/2018   CREATININE 0.63 03/15/2018   CALCIUM 9.9 03/15/2018   GFRNONAA >60 03/15/2018   GFRAA >60 03/15/2018    Lab Results  Component Value Date   WBC 6.2 03/15/2018   NEUTROABS 4.2 03/15/2018   HGB 15.0 03/15/2018   HCT 45.0 03/15/2018   MCV 94.6 03/15/2018   PLT 252 03/15/2018     STUDIES: No results found.  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  PLAN:    1. 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  Patient underwent lumpectomy and sentinel node biopsy on July 15, 2010.  She received adjuvant chemotherapy and XRT completing in 2012.  She was then given letrozole, but discontinued treatment after approximately 4 years.  Her most recent mammogram on May 04, 2020 was reported as BI-RADS 2.  Repeat in September 2022.  Return to clinic in 1 year for routine evaluation. 2. Osteoporosis: Bone mineral density on November 28, 2013 was reported as T score of -2.6.  Patient continues to refuse Fosamax, although is taking calcium and vitamin D supplementation.  She continues to refuse a repeat bone mineral density. She has expressed understanding of the long-term side effects of osteoporosis with using chronic prednisone.  Continue management per primary care. 3.  Anxiety: Chronic and unchanged.  Continue Prozac 20 mg as well as Xanax 0.25 mg as needed. 4.  Breast erythema/rash: Unclear etiology.  Patient requires between 20 and 30 mg daily.   She has been evaluated by 2 different radiation oncologist, 2 different dermatologist, a rheumatologist, and surgery.  She reports no follow-up with any further doctors, although expressed interest in returning to dermatology and/or rheumatology in the future if possible.  She currently is taking 20 mg/day and this is now managed by her primary care physician. 5.  Lymphedema: Right arm.  Continue follow-up in lymphedema clinic as needed.   Patient expressed understanding and was in agreement with this plan. She also understands that She can call clinic at any time with any questions, concerns, or complaints.   Breast cancer   Staging form: Breast, AJCC 7th Edition     Clinical stage from 01/26/2015: Stage IIA (T1c, N1, M0) - Signed by Lloyd Huger, MD on 01/26/2015   Lloyd Huger, MD   12/04/2020 12:22 PM

## 2020-12-03 ENCOUNTER — Other Ambulatory Visit: Payer: Self-pay

## 2020-12-03 ENCOUNTER — Encounter: Payer: Self-pay | Admitting: Oncology

## 2020-12-03 ENCOUNTER — Inpatient Hospital Stay: Payer: BC Managed Care – PPO | Attending: Oncology | Admitting: Oncology

## 2020-12-03 VITALS — BP 108/56 | HR 53 | Temp 97.4°F | Ht 66.0 in

## 2020-12-03 DIAGNOSIS — C50411 Malignant neoplasm of upper-outer quadrant of right female breast: Secondary | ICD-10-CM | POA: Diagnosis not present

## 2020-12-03 DIAGNOSIS — I890 Lymphedema, not elsewhere classified: Secondary | ICD-10-CM | POA: Insufficient documentation

## 2020-12-03 DIAGNOSIS — I10 Essential (primary) hypertension: Secondary | ICD-10-CM | POA: Diagnosis not present

## 2020-12-03 DIAGNOSIS — Z7952 Long term (current) use of systemic steroids: Secondary | ICD-10-CM | POA: Diagnosis not present

## 2020-12-03 DIAGNOSIS — Z79899 Other long term (current) drug therapy: Secondary | ICD-10-CM | POA: Diagnosis not present

## 2020-12-03 DIAGNOSIS — F419 Anxiety disorder, unspecified: Secondary | ICD-10-CM | POA: Diagnosis not present

## 2020-12-03 DIAGNOSIS — R21 Rash and other nonspecific skin eruption: Secondary | ICD-10-CM | POA: Diagnosis not present

## 2020-12-03 DIAGNOSIS — Z9221 Personal history of antineoplastic chemotherapy: Secondary | ICD-10-CM | POA: Insufficient documentation

## 2020-12-03 DIAGNOSIS — Z170 Estrogen receptor positive status [ER+]: Secondary | ICD-10-CM | POA: Diagnosis not present

## 2020-12-03 DIAGNOSIS — Z79811 Long term (current) use of aromatase inhibitors: Secondary | ICD-10-CM | POA: Diagnosis not present

## 2021-07-07 ENCOUNTER — Ambulatory Visit
Admission: RE | Admit: 2021-07-07 | Discharge: 2021-07-07 | Disposition: A | Discharge status: Home / Self Care | Payer: Medicare Other | Source: Ambulatory Visit | Attending: Oncology | Admitting: Oncology

## 2021-07-07 ENCOUNTER — Other Ambulatory Visit: Payer: Self-pay

## 2021-07-07 DIAGNOSIS — C50411 Malignant neoplasm of upper-outer quadrant of right female breast: Secondary | ICD-10-CM

## 2021-07-07 DIAGNOSIS — Z170 Estrogen receptor positive status [ER+]: Secondary | ICD-10-CM

## 2021-07-07 DIAGNOSIS — Z853 Personal history of malignant neoplasm of breast: Secondary | ICD-10-CM | POA: Diagnosis not present

## 2021-07-07 DIAGNOSIS — Z1231 Encounter for screening mammogram for malignant neoplasm of breast: Secondary | ICD-10-CM | POA: Insufficient documentation

## 2021-11-30 ENCOUNTER — Telehealth: Payer: Self-pay | Admitting: *Deleted

## 2021-11-30 ENCOUNTER — Encounter: Payer: Self-pay | Admitting: *Deleted

## 2021-11-30 NOTE — Telephone Encounter (Signed)
Patient called stating that she can see her mammogram report in My Chart and is asking why she needs to come in to be seen. She is scheduled for a 1 year follow up appointment and is not on AI treatment. Looks like her initial diagnosed was in 2011. Please advise ?

## 2021-11-30 NOTE — Telephone Encounter (Signed)
Attempted to contact patient to let her =know that appointment has been cancelled, she answered, but could not be heard. I will send a mychart message to her ?

## 2021-12-03 ENCOUNTER — Ambulatory Visit: Payer: Self-pay | Admitting: Nurse Practitioner

## 2021-12-06 ENCOUNTER — Inpatient Hospital Stay: Payer: Medicare Other | Admitting: Nurse Practitioner

## 2022-01-26 ENCOUNTER — Other Ambulatory Visit: Payer: Self-pay | Admitting: Family Medicine

## 2022-01-26 DIAGNOSIS — Z1231 Encounter for screening mammogram for malignant neoplasm of breast: Secondary | ICD-10-CM

## 2022-01-26 DIAGNOSIS — Z780 Asymptomatic menopausal state: Secondary | ICD-10-CM

## 2022-03-02 ENCOUNTER — Other Ambulatory Visit: Payer: Medicare Other

## 2022-03-22 ENCOUNTER — Other Ambulatory Visit: Payer: Medicare Other

## 2022-03-28 ENCOUNTER — Other Ambulatory Visit: Payer: Self-pay | Admitting: Family Medicine

## 2022-03-28 ENCOUNTER — Other Ambulatory Visit: Payer: Self-pay

## 2022-03-28 DIAGNOSIS — N63 Unspecified lump in unspecified breast: Secondary | ICD-10-CM

## 2022-04-06 ENCOUNTER — Other Ambulatory Visit: Payer: Medicare Other

## 2022-04-14 ENCOUNTER — Other Ambulatory Visit: Payer: Medicare Other

## 2022-04-14 ENCOUNTER — Inpatient Hospital Stay: Admission: RE | Admit: 2022-04-14 | Payer: Medicare Other | Source: Ambulatory Visit

## 2022-04-29 ENCOUNTER — Other Ambulatory Visit: Payer: Medicare Other

## 2022-05-12 ENCOUNTER — Ambulatory Visit
Admission: RE | Admit: 2022-05-12 | Discharge: 2022-05-12 | Disposition: A | Discharge status: Home / Self Care | Payer: Medicare Other | Source: Ambulatory Visit | Attending: Family Medicine | Admitting: Family Medicine

## 2022-05-12 DIAGNOSIS — R922 Inconclusive mammogram: Secondary | ICD-10-CM | POA: Insufficient documentation

## 2022-05-12 DIAGNOSIS — Z923 Personal history of irradiation: Secondary | ICD-10-CM | POA: Diagnosis not present

## 2022-05-12 DIAGNOSIS — Z853 Personal history of malignant neoplasm of breast: Secondary | ICD-10-CM | POA: Insufficient documentation

## 2022-05-12 DIAGNOSIS — Z1239 Encounter for other screening for malignant neoplasm of breast: Secondary | ICD-10-CM | POA: Insufficient documentation

## 2022-05-12 DIAGNOSIS — N63 Unspecified lump in unspecified breast: Secondary | ICD-10-CM | POA: Diagnosis not present

## 2022-05-26 ENCOUNTER — Other Ambulatory Visit: Payer: Self-pay | Admitting: Family Medicine

## 2022-05-26 DIAGNOSIS — Z1231 Encounter for screening mammogram for malignant neoplasm of breast: Secondary | ICD-10-CM

## 2022-08-25 ENCOUNTER — Ambulatory Visit
Admission: RE | Admit: 2022-08-25 | Discharge: 2022-08-25 | Disposition: A | Discharge status: Home / Self Care | Payer: Medicare Other | Source: Ambulatory Visit | Attending: Family Medicine | Admitting: Family Medicine

## 2022-08-25 DIAGNOSIS — Z1231 Encounter for screening mammogram for malignant neoplasm of breast: Secondary | ICD-10-CM | POA: Diagnosis not present

## 2023-12-14 ENCOUNTER — Other Ambulatory Visit: Payer: Self-pay | Admitting: Family Medicine

## 2023-12-14 DIAGNOSIS — Z1231 Encounter for screening mammogram for malignant neoplasm of breast: Secondary | ICD-10-CM

## 2024-01-11 ENCOUNTER — Ambulatory Visit

## 2024-01-30 ENCOUNTER — Ambulatory Visit
